# Patient Record
Sex: Female | Born: 1960 | Race: White | Hispanic: No | Marital: Married | Smoking: Never smoker
Health system: Southern US, Community
[De-identification: ages and names within clinical notes are randomized; demographics above are authoritative.]

## PROBLEM LIST (undated history)

## (undated) DIAGNOSIS — F419 Anxiety disorder, unspecified: Secondary | ICD-10-CM

## (undated) DIAGNOSIS — F32A Depression, unspecified: Secondary | ICD-10-CM

## (undated) DIAGNOSIS — F329 Major depressive disorder, single episode, unspecified: Secondary | ICD-10-CM

## (undated) DIAGNOSIS — K219 Gastro-esophageal reflux disease without esophagitis: Secondary | ICD-10-CM

## (undated) DIAGNOSIS — N6310 Unspecified lump in the right breast, unspecified quadrant: Secondary | ICD-10-CM

## (undated) HISTORY — PX: DIAGNOSTIC LAPAROSCOPY: SUR761

## (undated) HISTORY — PX: BREAST BIOPSY: SHX20

---

## 1997-05-26 ENCOUNTER — Other Ambulatory Visit: Admission: RE | Admit: 1997-05-26 | Discharge: 1997-05-26 | Payer: Self-pay | Admitting: Obstetrics and Gynecology

## 1998-06-01 ENCOUNTER — Other Ambulatory Visit: Admission: RE | Admit: 1998-06-01 | Discharge: 1998-06-01 | Payer: Self-pay | Admitting: Obstetrics and Gynecology

## 1998-06-16 ENCOUNTER — Encounter: Payer: Self-pay | Admitting: Obstetrics and Gynecology

## 1998-06-16 ENCOUNTER — Ambulatory Visit (HOSPITAL_COMMUNITY): Admission: RE | Admit: 1998-06-16 | Discharge: 1998-06-16 | Payer: Self-pay | Admitting: Obstetrics and Gynecology

## 1999-02-03 ENCOUNTER — Ambulatory Visit (HOSPITAL_COMMUNITY): Admission: RE | Admit: 1999-02-03 | Discharge: 1999-02-03 | Payer: Self-pay | Admitting: Neurosurgery

## 1999-02-03 ENCOUNTER — Encounter: Payer: Self-pay | Admitting: Neurosurgery

## 1999-06-05 ENCOUNTER — Other Ambulatory Visit: Admission: RE | Admit: 1999-06-05 | Discharge: 1999-06-05 | Payer: Self-pay | Admitting: Obstetrics and Gynecology

## 2000-06-26 ENCOUNTER — Other Ambulatory Visit: Admission: RE | Admit: 2000-06-26 | Discharge: 2000-06-26 | Payer: Self-pay | Admitting: *Deleted

## 2001-07-03 ENCOUNTER — Other Ambulatory Visit: Admission: RE | Admit: 2001-07-03 | Discharge: 2001-07-03 | Payer: Self-pay | Admitting: Obstetrics and Gynecology

## 2001-12-09 ENCOUNTER — Encounter (INDEPENDENT_AMBULATORY_CARE_PROVIDER_SITE_OTHER): Payer: Self-pay | Admitting: Specialist

## 2001-12-09 ENCOUNTER — Ambulatory Visit (HOSPITAL_COMMUNITY): Admission: RE | Admit: 2001-12-09 | Discharge: 2001-12-09 | Payer: Self-pay | Admitting: Gastroenterology

## 2002-04-28 DIAGNOSIS — C4491 Basal cell carcinoma of skin, unspecified: Secondary | ICD-10-CM

## 2002-04-28 HISTORY — DX: Basal cell carcinoma of skin, unspecified: C44.91

## 2002-07-15 ENCOUNTER — Other Ambulatory Visit: Admission: RE | Admit: 2002-07-15 | Discharge: 2002-07-15 | Payer: Self-pay | Admitting: Obstetrics and Gynecology

## 2002-08-26 ENCOUNTER — Ambulatory Visit (HOSPITAL_COMMUNITY): Admission: RE | Admit: 2002-08-26 | Discharge: 2002-08-26 | Payer: Self-pay | Admitting: Obstetrics and Gynecology

## 2002-08-26 ENCOUNTER — Encounter: Payer: Self-pay | Admitting: Obstetrics and Gynecology

## 2003-09-03 ENCOUNTER — Ambulatory Visit (HOSPITAL_COMMUNITY): Admission: RE | Admit: 2003-09-03 | Discharge: 2003-09-03 | Payer: Self-pay | Admitting: Obstetrics and Gynecology

## 2004-09-05 ENCOUNTER — Ambulatory Visit (HOSPITAL_COMMUNITY): Admission: RE | Admit: 2004-09-05 | Discharge: 2004-09-05 | Payer: Self-pay | Admitting: Obstetrics and Gynecology

## 2005-09-07 ENCOUNTER — Ambulatory Visit (HOSPITAL_COMMUNITY): Admission: RE | Admit: 2005-09-07 | Discharge: 2005-09-07 | Payer: Self-pay | Admitting: Obstetrics and Gynecology

## 2006-09-11 ENCOUNTER — Ambulatory Visit (HOSPITAL_COMMUNITY): Admission: RE | Admit: 2006-09-11 | Discharge: 2006-09-11 | Payer: Self-pay | Admitting: Obstetrics & Gynecology

## 2007-09-16 ENCOUNTER — Ambulatory Visit (HOSPITAL_COMMUNITY): Admission: RE | Admit: 2007-09-16 | Discharge: 2007-09-16 | Payer: Self-pay | Admitting: Obstetrics & Gynecology

## 2007-09-19 ENCOUNTER — Encounter: Admission: RE | Admit: 2007-09-19 | Discharge: 2007-09-19 | Payer: Self-pay | Admitting: Obstetrics & Gynecology

## 2007-10-08 ENCOUNTER — Encounter: Admission: RE | Admit: 2007-10-08 | Discharge: 2007-10-08 | Payer: Self-pay | Admitting: Neurosurgery

## 2007-10-23 ENCOUNTER — Encounter: Admission: RE | Admit: 2007-10-23 | Discharge: 2007-10-23 | Payer: Self-pay | Admitting: Neurosurgery

## 2008-10-01 ENCOUNTER — Encounter: Admission: RE | Admit: 2008-10-01 | Discharge: 2008-10-01 | Payer: Self-pay | Admitting: Obstetrics & Gynecology

## 2009-10-14 ENCOUNTER — Encounter: Admission: RE | Admit: 2009-10-14 | Discharge: 2009-10-14 | Payer: Self-pay | Admitting: Obstetrics & Gynecology

## 2010-06-09 NOTE — Op Note (Signed)
NAME:  Kelli Moore, Kelli Moore                            ACCOUNT NO.:  [id number]   MEDICAL RECORD NO.:  [id number]                   PATIENT TYPE:  AMB   LOCATION:  ENDO                                 FACILITY:  [facility]   PHYSICIAN:  Anselmo Rod, M.D.               DATE OF BIRTH:  [birth year]   DATE OF PROCEDURE:  12/09/2001  DATE OF DISCHARGE:                                 OPERATIVE REPORT   PROCEDURE PERFORMED:  Colonoscopy with biopsy.   ENDOSCOPIST:  Anselmo Rod, M.D.   INSTRUMENT USED:  Pediatric adjustable Olympus colonoscope.   INDICATIONS FOR PROCEDURE:  [age]-year-old white female with a family  history of rectal cancer in her mother and colon cancer in her maternal  uncle.  Rule out colonic polyps, masses, etc.   PREPROCEDURE PREPARATION:  Informed consent was procured from the patient.  The patient fasted for eight hours prior to the procedure and prepped with a  bottle of MiraLax and ________ the night prior to the procedure.   PREPROCEDURE PHYSICAL:  VITAL SIGNS:  The patient has stable vital signs.  NECK:  Supple.  CHEST:  Clear to auscultation.  HEART:  S1 and S2, regular.  ABDOMEN:  Soft with normal bowel sounds.   DESCRIPTION OF THE PROCEDURE:  The patient was placed in the left lateral  decubitus position and sedated with 70 mg of Demerol and 7 mg of Versed  intravenously.  Once the patient was adequately sedated and maintained on  low-flow oxygen, and continuous cardiac monitoring the Olympus video  colonoscope was advanced from the rectum to the cecum and terminal ileum  without difficulty.  There was patchy erythema noted in the rectum.  This  was biopsied to rule out proctitis.  The rest of the colonic mucosa up to  the terminal ileum appeared normal.  Small internal hemorrhoids were seen on  retroflexion.   IMPRESSION:  1. Normal colonoscopy up to the terminal ileum, except for small nonbleeding     internal hemorrhoids and patchy erythema in  the rectum, biopsies done;     results pending.  2. No large masses or polyps seen.   RECOMMENDATIONS:  1. Await pathology results.  2.     Outpatient follow up within the next two weeks for further recommendations.   3. Considering the patient's family history of rectal and colon cancer     repeat colorectal screening is recommended in next five years unless the     patient develops any abnormal symptoms in the interim.                                                 Anselmo Rod, M.D.    JNM/MEDQ  D:  12/09/2001  T:  12/10/2001  Job:  [id number]   cc:   Maxie Better, M.D.  [address]  [address]  [place]  [place] [address]  Fax: [phone]

## 2010-10-04 ENCOUNTER — Other Ambulatory Visit: Payer: Self-pay | Admitting: Obstetrics & Gynecology

## 2010-10-04 DIAGNOSIS — Z1231 Encounter for screening mammogram for malignant neoplasm of breast: Secondary | ICD-10-CM

## 2010-11-01 ENCOUNTER — Ambulatory Visit
Admission: RE | Admit: 2010-11-01 | Discharge: 2010-11-01 | Disposition: A | Discharge status: Home / Self Care | Payer: BC Managed Care – PPO | Source: Ambulatory Visit | Attending: Obstetrics & Gynecology | Admitting: Obstetrics & Gynecology

## 2010-11-01 DIAGNOSIS — Z1231 Encounter for screening mammogram for malignant neoplasm of breast: Secondary | ICD-10-CM

## 2011-10-02 ENCOUNTER — Other Ambulatory Visit: Payer: Self-pay | Admitting: Obstetrics & Gynecology

## 2011-10-02 DIAGNOSIS — Z1231 Encounter for screening mammogram for malignant neoplasm of breast: Secondary | ICD-10-CM

## 2011-10-19 ENCOUNTER — Other Ambulatory Visit: Payer: Self-pay | Admitting: Physician Assistant

## 2011-11-06 ENCOUNTER — Ambulatory Visit: Payer: BC Managed Care – PPO

## 2011-11-07 ENCOUNTER — Ambulatory Visit
Admission: RE | Admit: 2011-11-07 | Discharge: 2011-11-07 | Disposition: A | Discharge status: Home / Self Care | Payer: BC Managed Care – PPO | Source: Ambulatory Visit | Attending: Obstetrics & Gynecology | Admitting: Obstetrics & Gynecology

## 2011-11-07 DIAGNOSIS — Z1231 Encounter for screening mammogram for malignant neoplasm of breast: Secondary | ICD-10-CM

## 2012-01-08 ENCOUNTER — Emergency Department (HOSPITAL_COMMUNITY)
Admission: EM | Admit: 2012-01-08 | Discharge: 2012-01-09 | Disposition: A | Discharge status: Home / Self Care | Payer: Managed Care, Other (non HMO) | Attending: Emergency Medicine | Admitting: Emergency Medicine

## 2012-01-08 ENCOUNTER — Encounter (HOSPITAL_COMMUNITY): Payer: Self-pay | Admitting: *Deleted

## 2012-01-08 DIAGNOSIS — F411 Generalized anxiety disorder: Secondary | ICD-10-CM | POA: Insufficient documentation

## 2012-01-08 DIAGNOSIS — T4272XA Poisoning by unspecified antiepileptic and sedative-hypnotic drugs, intentional self-harm, initial encounter: Secondary | ICD-10-CM | POA: Insufficient documentation

## 2012-01-08 DIAGNOSIS — T50902A Poisoning by unspecified drugs, medicaments and biological substances, intentional self-harm, initial encounter: Secondary | ICD-10-CM

## 2012-01-08 DIAGNOSIS — F430 Acute stress reaction: Secondary | ICD-10-CM

## 2012-01-08 DIAGNOSIS — Z3202 Encounter for pregnancy test, result negative: Secondary | ICD-10-CM | POA: Insufficient documentation

## 2012-01-08 DIAGNOSIS — R000 Tachycardia, unspecified: Secondary | ICD-10-CM | POA: Insufficient documentation

## 2012-01-08 DIAGNOSIS — F3289 Other specified depressive episodes: Secondary | ICD-10-CM | POA: Insufficient documentation

## 2012-01-08 DIAGNOSIS — F329 Major depressive disorder, single episode, unspecified: Secondary | ICD-10-CM | POA: Insufficient documentation

## 2012-01-08 DIAGNOSIS — T4271XA Poisoning by unspecified antiepileptic and sedative-hypnotic drugs, accidental (unintentional), initial encounter: Secondary | ICD-10-CM | POA: Insufficient documentation

## 2012-01-08 DIAGNOSIS — T426X2A Poisoning by other antiepileptic and sedative-hypnotic drugs, intentional self-harm, initial encounter: Secondary | ICD-10-CM | POA: Insufficient documentation

## 2012-01-08 DIAGNOSIS — Z79899 Other long term (current) drug therapy: Secondary | ICD-10-CM | POA: Insufficient documentation

## 2012-01-08 HISTORY — DX: Depression, unspecified: F32.A

## 2012-01-08 HISTORY — DX: Major depressive disorder, single episode, unspecified: F32.9

## 2012-01-08 LAB — CBC
HCT: 39.6 % (ref 36.0–46.0)
Hemoglobin: 13.5 g/dL (ref 12.0–15.0)
MCH: 30.7 pg (ref 26.0–34.0)
MCHC: 34.1 g/dL (ref 30.0–36.0)
MCV: 90 fL (ref 78.0–100.0)
Platelets: 322 10*3/uL (ref 150–400)
RBC: 4.4 MIL/uL (ref 3.87–5.11)
RDW: 12.4 % (ref 11.5–15.5)
WBC: 8 10*3/uL (ref 4.0–10.5)

## 2012-01-08 LAB — COMPREHENSIVE METABOLIC PANEL
ALT: 14 U/L (ref 0–35)
AST: 17 U/L (ref 0–37)
Albumin: 3.7 g/dL (ref 3.5–5.2)
Alkaline Phosphatase: 50 U/L (ref 39–117)
BUN: 9 mg/dL (ref 6–23)
CO2: 25 mEq/L (ref 19–32)
Calcium: 9.5 mg/dL (ref 8.4–10.5)
Chloride: 101 mEq/L (ref 96–112)
Creatinine, Ser: 0.62 mg/dL (ref 0.50–1.10)
GFR calc Af Amer: >90 mL/min (ref >90)
GFR calc non Af Amer: >90 mL/min (ref >90)
Glucose, Bld: 96 mg/dL (ref 70–99)
Potassium: 3.7 mEq/L (ref 3.5–5.1)
Sodium: 135 mEq/L (ref 135–145)
Total Bilirubin: 0.1 mg/dL — ABNORMAL LOW (ref 0.3–1.2)
Total Protein: 7.2 g/dL (ref 6.0–8.3)

## 2012-01-08 LAB — URINALYSIS, ROUTINE W REFLEX MICROSCOPIC
Bilirubin Urine: NEGATIVE
Glucose, UA: NEGATIVE mg/dL
Ketones, ur: NEGATIVE mg/dL
Leukocytes, UA: NEGATIVE
Nitrite: NEGATIVE
Protein, ur: NEGATIVE mg/dL
Specific Gravity, Urine: 1.011 (ref 1.005–1.030)
Urobilinogen, UA: 0.2 mg/dL (ref 0.0–1.0)
pH: 8 (ref 5.0–8.0)

## 2012-01-08 LAB — RAPID URINE DRUG SCREEN, HOSP PERFORMED
Amphetamines: NOT DETECTED
Barbiturates: NOT DETECTED
Benzodiazepines: NOT DETECTED
Cocaine: NOT DETECTED
Opiates: NOT DETECTED
Tetrahydrocannabinol: NOT DETECTED

## 2012-01-08 LAB — SALICYLATE LEVEL: Salicylate Lvl: <2 mg/dL — ABNORMAL LOW (ref 2.8–20.0)

## 2012-01-08 LAB — ACETAMINOPHEN LEVEL: Acetaminophen (Tylenol), Serum: <15 ug/mL (ref 10–30)

## 2012-01-08 LAB — PREGNANCY, URINE: Preg Test, Ur: NEGATIVE

## 2012-01-08 LAB — URINE MICROSCOPIC-ADD ON

## 2012-01-08 MED ORDER — IBUPROFEN 600 MG PO TABS: 600.0000 mg | ORAL_TABLET | Freq: Three times a day (TID) | ORAL | Status: DC | PRN

## 2012-01-08 MED ORDER — ADULT MULTIVITAMIN W/MINERALS CH: 1.0000 | ORAL_TABLET | Freq: Every day | ORAL | Status: DC

## 2012-01-08 MED ORDER — ALUM & MAG HYDROXIDE-SIMETH 200-200-20 MG/5ML PO SUSP: 30.0000 mL | ORAL | Status: DC | PRN

## 2012-01-08 MED ORDER — ACETAMINOPHEN 325 MG PO TABS: 650.0000 mg | ORAL_TABLET | ORAL | Status: DC | PRN

## 2012-01-08 MED ORDER — ONDANSETRON HCL 4 MG PO TABS: 4.0000 mg | ORAL_TABLET | Freq: Three times a day (TID) | ORAL | Status: DC | PRN

## 2012-01-08 MED ORDER — LEVONORGESTREL-ETHINYL ESTRAD 0.1-20 MG-MCG PO TABS: 1.0000 | ORAL_TABLET | Freq: Every day | ORAL | Status: DC

## 2012-01-08 MED ORDER — SODIUM CHLORIDE 0.9 % IV BOLUS (SEPSIS)
1000.0000 mL | Freq: Once | INTRAVENOUS | Status: AC
  Administered 2012-01-08: 1000 mL via INTRAVENOUS

## 2012-01-08 NOTE — ED Notes (Signed)
Husband will be taking patient clothes home.

## 2012-01-08 NOTE — ED Notes (Signed)
Pt transferred from Main ED, presents with drug overdose of Prozac and Ambien tonight.  Pt brought in by husband.  Pt reports she had a severe anxiety attack. And took meds. Approximately 10Prozac and 4-5/10mg  Ambien.   Pt reports she is feeling hopeless at present.  Denies SI in past.  Husband reports pt having issues with boss at work, is really stressed out and missing her deceased mother.

## 2012-01-08 NOTE — ED Notes (Signed)
Family friend came to visit.

## 2012-01-08 NOTE — ED Notes (Signed)
Patient ambulated to bathroom with one assist because she was still feeling dizzy.

## 2012-01-08 NOTE — ED Notes (Signed)
Pt came home around lunch time from work, upset, told husband she just wanted to sleep, took unknown amount of prozac and ambein, Pt states " I kinda feel SI". Pt upset in triage, crying. Pt sleepy but can answer questions, confused on where she is.

## 2012-01-08 NOTE — ED Notes (Signed)
MD at bedside. 

## 2012-01-08 NOTE — ED Notes (Signed)
Patient has been having lots of stress at work and finally resigned today, came home and told her husband she just wanted to sleep.  Took unknown amount of Ambien and Prozac.  Patient tearful and drowsy.

## 2012-01-08 NOTE — ED Provider Notes (Signed)
History    51yf with drug overdose. Took 3-4 10mg  tabs of ambien and 4-5 20mg  fluoxetine. Took less than 1 hour prior to arrival. Pt upset over situation at work. Prior to this told husband that she felt like she was having a nervous breakdown. Then said she just wanted to go to sleep and took the pills. Husband found and very drowsy. Hx of depression. No previous suicide attempt. Pt not completely forthcoming in intent of taking the pills. Denies ETOH or drug use. Pt denies other ingestion and husband doesn't think she took anything else. Pt denies pain anywhere. No SOB. No n/v. No hallucinations.  CSN: [id number]  Arrival date & time 01/08/12  1455   First MD Initiated Contact with Patient 01/08/12 1508      Chief Complaint  Patient presents with  . Drug Overdose    (Consider location/radiation/quality/duration/timing/severity/associated sxs/prior treatment) HPI  Past Medical History  Diagnosis Date  . Depression     History reviewed. No pertinent past surgical history.  No family history on file.  History  Substance Use Topics  . Smoking status: Never Smoker   . Smokeless tobacco: Never Used  . Alcohol Use: No    OB History    Grav Para Term Preterm Abortions TAB SAB Ect Mult Living                  Review of Systems  All systems reviewed and negative, other than as noted in HPI.   Allergies  Review of patient's allergies indicates no known allergies.  Home Medications   Current Outpatient Rx  Name  Route  Sig  Dispense  Refill  . FLUOXETINE HCL 20 MG PO CAPS   Oral   Take 20 mg by mouth daily.          . ADULT MULTIVITAMIN W/MINERALS CH   Oral   Take 1 tablet by mouth daily.         . ORSYTHIA 0.1-20 MG-MCG PO TABS   Oral   Take 1 tablet by mouth daily.          Marland Kitchen ZOLPIDEM TARTRATE 10 MG PO TABS   Oral   Take 10 mg by mouth at bedtime as needed. For sleep.           BP 138/81  Pulse 123  Temp 98.3 F (36.8 C) (Oral)  Resp 18   SpO2 99%  Physical Exam  Nursing note and vitals reviewed. Constitutional: She appears well-developed and well-nourished. No distress.       No external signs of acute trauma  HENT:  Head: Normocephalic and atraumatic.  Eyes: Conjunctivae normal are normal. Pupils are equal, round, and reactive to light. Right eye exhibits no discharge. Left eye exhibits no discharge.  Neck: Neck supple.  Cardiovascular: Normal rate, regular rhythm and normal heart sounds.  Exam reveals no gallop and no friction rub.   No murmur heard. Pulmonary/Chest: Effort normal and breath sounds normal. No respiratory distress.  Abdominal: Soft. She exhibits no distension. There is no tenderness.  Musculoskeletal: She exhibits no edema and no tenderness.  Neurological: No cranial nerve deficit. She exhibits normal muscle tone. Coordination normal.       Drowsy. GCS 14. E3. Speech slow but clear. No focal deficits noted.  Skin: Skin is warm and dry.  Psychiatric: Her behavior is normal. Thought content normal.       Crying at times. Speech slow but clear. Content appropriate. Does not appear to be responding  to internal stimuli.     ED Course  Procedures (including critical care time)  Labs Reviewed  COMPREHENSIVE METABOLIC PANEL - Abnormal; Notable for the following:    Total Bilirubin 0.1 (*)     All other components within normal limits  SALICYLATE LEVEL - Abnormal; Notable for the following:    Salicylate Lvl <2.0 (*)     All other components within normal limits  URINALYSIS, ROUTINE W REFLEX MICROSCOPIC - Abnormal; Notable for the following:    Hgb urine dipstick SMALL (*)     All other components within normal limits  CBC  ACETAMINOPHEN LEVEL  PREGNANCY, URINE  URINE RAPID DRUG SCREEN (HOSP PERFORMED)  URINE MICROSCOPIC-ADD ON   No results found.  EKG:  Rhythm: sinus tachycardia Rate: 104 Axis: normal Intervals/Conduction: LAE.  ST segments: normal   1. Intentional drug overdose   2.  Stress reaction       MDM  51yF with intentional drug overdose of unclear intent. Tachycardic but otherwise HD stable. Drowsy but awakens to voice. Protecting airway. Will medically screen/clear and then obtain psychiatry consultation.  Patient has been medically cleared at this time. Tachycardia resolved. Patient still protecting her airway. HD stable. Workup fairly unremarkable        Raeford Razor, MD 01/08/12 2219

## 2012-01-08 NOTE — ED Notes (Signed)
Report called to psyche ED. 

## 2012-01-09 MED ORDER — CLONAZEPAM 0.5 MG PO TABS: 0.5000 mg | ORAL_TABLET | Freq: Every day | ORAL | Status: DC

## 2012-01-09 NOTE — Progress Notes (Signed)
CSW met with pt at nurses station. Pt stated she was ready to leave and that we could not keep her here against her will. CSW discussed with pt the process of having the psychiatrist consult. Pt stated that she did not attempt suicide. CSW explained to patient that due to the amount of medication that she took, it can be interpreted to be an attempt to commit suicide and until the psychiatrist evaluates patient the MD will not allow patient to leave. CSW explained to patient that if she did not want to wait for the psychiatrist evaluation then patient would be involuntarily commited. Patient agreed to wait for psychiatrist.   Pt stated she is tired of waiting and ready to be home. Pt asked if her husband was aware. CSW informed patient that pt husband was informed of visiting hours and telephone hours here and then pending an evaluation patient would be in the psychiatric ed. CSW explained to patient that after patient has been seen by psychiatrist and patient would like information to be shared with pt husband, pt can sign a consent to release information.   Catha Gosselin, LCSWA  [phone] .01/09/2012 1200pm

## 2012-01-09 NOTE — ED Provider Notes (Signed)
  Physical Exam  BP 142/88  Pulse 88  Temp 98.8 F (37.1 C) (Oral)  Resp 20  SpO2 98%  Physical Exam  ED Course  Procedures  MDM I have discussed the psychiatrist recommendation with the patient, she states that she feels comfortable with recommendations, she has a bright affect and denies any suicidal thoughts at this time. She will be stable for discharge. Recommendations for starting clonazepam nightly have been started, continue Prozac, Ambien when necessary      Vida Roller, MD 01/09/12 [phone]

## 2012-08-25 ENCOUNTER — Other Ambulatory Visit: Payer: Self-pay

## 2012-08-25 DIAGNOSIS — Z1231 Encounter for screening mammogram for malignant neoplasm of breast: Secondary | ICD-10-CM

## 2012-11-03 ENCOUNTER — Ambulatory Visit: Payer: BC Managed Care – PPO | Admitting: Cardiovascular Disease

## 2012-11-07 ENCOUNTER — Ambulatory Visit
Admission: RE | Admit: 2012-11-07 | Discharge: 2012-11-07 | Disposition: A | Discharge status: Home / Self Care | Payer: Managed Care, Other (non HMO) | Source: Ambulatory Visit

## 2012-11-07 DIAGNOSIS — Z1231 Encounter for screening mammogram for malignant neoplasm of breast: Secondary | ICD-10-CM

## 2013-10-13 ENCOUNTER — Other Ambulatory Visit: Payer: Self-pay

## 2013-10-14 ENCOUNTER — Other Ambulatory Visit: Payer: Self-pay

## 2013-10-14 DIAGNOSIS — Z1231 Encounter for screening mammogram for malignant neoplasm of breast: Secondary | ICD-10-CM

## 2013-11-09 ENCOUNTER — Ambulatory Visit
Admission: RE | Admit: 2013-11-09 | Discharge: 2013-11-09 | Disposition: A | Discharge status: Home / Self Care | Payer: Managed Care, Other (non HMO) | Source: Ambulatory Visit

## 2013-11-09 DIAGNOSIS — Z1231 Encounter for screening mammogram for malignant neoplasm of breast: Secondary | ICD-10-CM

## 2014-01-22 HISTORY — PX: BUNIONECTOMY: SHX129

## 2014-04-12 ENCOUNTER — Telehealth: Payer: Self-pay | Admitting: Cardiovascular Disease

## 2014-04-12 NOTE — Telephone Encounter (Signed)
Have her see PA this week I texted husband Needs ECG and lab work

## 2014-04-12 NOTE — Telephone Encounter (Signed)
New Message  Pt husband Ronalee Belts [place]) and father are pts of Dr. Johnsie Cancel, and insisted on letting Dr. Johnsie Cancel know of pt's recent heart 'fluttering'. Pt was given next available NP appt, but requested to speak w/ Rn. Please call back and discuss.

## 2014-04-12 NOTE — Telephone Encounter (Signed)
SPOKE WITH PTS  HUSBAND  PT  HAS  HAD   SEVERAL  LONG LASTING EPISODES OF  PALPITATIONS   OVER  THE  LAST  COUPLE OF DAYS  IS  FEELING  LIGHTHEADED AND   EXHAUSTED  IS  REQUESTING EARLIER APPT  NOTHING  AVAILABLE  WILL DISCUSS WITH DR  Johnsie Cancel .Adonis Housekeeper

## 2014-04-12 NOTE — Telephone Encounter (Signed)
PT  TO BE   PUT  ON FLEX  PA  SCHEDULE FOR  WED  AS  DR Johnsie Cancel  IS  DOD THAT DAY  PT'S  HUSBAND  AWARE  TO EXPECT  CALL WITH TIME  FOR  WED .Adonis Housekeeper

## 2014-04-13 ENCOUNTER — Telehealth: Payer: Self-pay | Admitting: Cardiovascular Disease

## 2014-04-13 NOTE — Telephone Encounter (Signed)
Walk in pt form " pt dropped off papers for Nurse" gave to [facility]

## 2014-04-14 ENCOUNTER — Encounter: Payer: Self-pay | Admitting: Cardiology

## 2014-04-14 ENCOUNTER — Ambulatory Visit (INDEPENDENT_AMBULATORY_CARE_PROVIDER_SITE_OTHER): Payer: Managed Care, Other (non HMO) | Admitting: Cardiology

## 2014-04-14 VITALS — BP 114/74 | HR 84 | Ht >= 80 in | Wt 147.4 lb

## 2014-04-14 DIAGNOSIS — R002 Palpitations: Secondary | ICD-10-CM | POA: Insufficient documentation

## 2014-04-14 MED ORDER — PROPRANOLOL HCL 10 MG PO TABS: 10.0000 mg | ORAL_TABLET | Freq: Two times a day (BID) | ORAL | Status: DC

## 2014-04-14 NOTE — Patient Instructions (Signed)
START TAKING INDERAL 10 MG TWICE AS NEEDED FOR PALPITATIONS     CONTACT THE OFFICE IF YOU HAVE ANY RETURNING SYMPTOMS

## 2014-04-14 NOTE — Progress Notes (Signed)
    04/14/2014 Kelli Moore   22-Jun-1960  [id number]  Primary Physician Limmie Patricia, MD Primary Cardiologist: Dr Johnsie Cancel  HPI:  54 y/o female, her father is a pt of Dr Johnsie Cancel and her husband is followed by Dr Johnsie Cancel. She has been seen in the past for palpations, she saw Dr Wynonia Lawman in 2014. She had a monitor and an echo then. It was determined she had PVCs. After that her symptoms went away till this past weekend. This past weekend she had several episodes of brief tachycardia- "7-10 beats". She told she had taken pseudoephedrine for sinus headache this past week. She has not had recurrent palpitations the last 24-48 hrs.    Current Outpatient Prescriptions  Medication Sig Dispense Refill  . Cholecalciferol (VITAMIN D3) 5000 UNITS CAPS Take 1 capsule by mouth daily.    . Cyanocobalamin (VITAMIN B 12 PO) Take 2,500 mg by mouth 2 (two) times daily.    . norethindrone (MICRONOR,CAMILA,ERRIN) 0.35 MG tablet Take 1 tablet by mouth daily.     . vitamin C (ASCORBIC ACID) 500 MG tablet Take 500 mg by mouth daily.    Marland Kitchen zolpidem (AMBIEN) 10 MG tablet Take 5 mg by mouth at bedtime. Patient taking one half tablet (5 mg) by mouth nightly for sleep     No current facility-administered medications for this visit.    No Known Allergies  History   Social History  . Marital Status: Married    Spouse Name: N/A  . Number of Children: N/A  . Years of Education: N/A   Occupational History  . Not on file.   Social History Main Topics  . Smoking status: Never Smoker   . Smokeless tobacco: Never Used  . Alcohol Use: No  . Drug Use: No  . Sexual Activity: Not on file   Other Topics Concern  . Not on file   Social History Narrative     Review of Systems: General: negative for chills, fever, night sweats or weight changes.  Cardiovascular: negative for chest pain, dyspnea on exertion, edema, orthopnea, palpitations, paroxysmal nocturnal dyspnea or shortness of breath Dermatological:  negative for rash Respiratory: negative for cough or wheezing Urologic: negative for hematuria Abdominal: negative for nausea, vomiting, diarrhea, bright red blood per rectum, melena, or hematemesis Neurologic: negative for visual changes, syncope, or dizziness All other systems reviewed and are otherwise negative except as noted above.    Blood pressure 114/74, pulse 84, height 7' (2.134 m), weight 147 lb 6.4 oz (66.86 kg).  General appearance: alert, cooperative and no distress Neck: no carotid bruit and no JVD Lungs: clear to auscultation bilaterally Heart: regular rate and rhythm Abdomen: soft, non-tender; bowel sounds normal; no masses,  no organomegaly Extremities: extremities normal, atraumatic, no cyanosis or edema Pulses: 2+ and symmetric Skin: Skin color, texture, turgor normal. No rashes or lesions Neurologic: Grossly normal  EKG NSR  ASSESSMENT AND PLAN:   Palpitations Seen as an add on for palpitations     PLAN  Pt was seen by Dr Johnsie Cancel and myself. We instructed her to avoid any stimulants. She was given an RX for Inderal 10 mg BID prn palpitations. We'll see her as needed.   Cedrick Partain KPA-C 04/14/2014 11:46 AM

## 2014-04-14 NOTE — Assessment & Plan Note (Signed)
Seen as an add on for palpitations 

## 2014-05-04 ENCOUNTER — Ambulatory Visit: Payer: Managed Care, Other (non HMO) | Admitting: Cardiovascular Disease

## 2014-09-22 ENCOUNTER — Ambulatory Visit (INDEPENDENT_AMBULATORY_CARE_PROVIDER_SITE_OTHER): Payer: Managed Care, Other (non HMO) | Admitting: Podiatry

## 2014-09-22 ENCOUNTER — Encounter: Payer: Self-pay | Admitting: Podiatry

## 2014-09-22 ENCOUNTER — Ambulatory Visit (INDEPENDENT_AMBULATORY_CARE_PROVIDER_SITE_OTHER): Payer: Managed Care, Other (non HMO)

## 2014-09-22 VITALS — BP 119/71 | HR 84 | Resp 16 | Ht 64.5 in | Wt 142.0 lb

## 2014-09-22 DIAGNOSIS — M779 Enthesopathy, unspecified: Secondary | ICD-10-CM

## 2014-09-22 DIAGNOSIS — M2010 Hallux valgus (acquired), unspecified foot: Secondary | ICD-10-CM | POA: Diagnosis not present

## 2014-09-22 MED ORDER — DICLOFENAC SODIUM 75 MG PO TBEC: 75.0000 mg | DELAYED_RELEASE_TABLET | Freq: Two times a day (BID) | ORAL | Status: DC

## 2014-09-22 NOTE — Patient Instructions (Signed)
Pre-Operative Instructions  Congratulations, you have decided to take an important step to improving your quality of life.  You can be assured that the doctors of [facility] will be with you every step of the way.  1. Plan to be at the surgery center/hospital at least 1 (one) hour prior to your scheduled time unless otherwise directed by the surgical center/hospital staff.  You must have a responsible adult accompany you, remain during the surgery and drive you home.  Make sure you have directions to the surgical center/hospital and know how to get there on time. 2. For hospital based surgery you will need to obtain a history and physical form from your family physician within 1 month prior to the date of surgery- we will give you a form for you primary physician.  3. We make every effort to accommodate the date you request for surgery.  There are however, times where surgery dates or times have to be moved.  We will contact you as soon as possible if a change in schedule is required.   4. No Aspirin/Ibuprofen for one week before surgery.  If you are on aspirin, any non-steroidal anti-inflammatory medications (Mobic, Aleve, Ibuprofen) you should stop taking it 7 days prior to your surgery.  You make take Tylenol  For pain prior to surgery.  5. Medications- If you are taking daily heart and blood pressure medications, seizure, reflux, allergy, asthma, anxiety, pain or diabetes medications, make sure the surgery center/hospital is aware before the day of surgery so they may notify you which medications to take or avoid the day of surgery. 6. No food or drink after midnight the night before surgery unless directed otherwise by surgical center/hospital staff. 7. No alcoholic beverages 24 hours prior to surgery.  No smoking 24 hours prior to or 24 hours after surgery. 8. Wear loose pants or shorts- loose enough to fit over bandages, boots, and casts. 9. No slip on shoes, sneakers are best. 10. Bring  your boot with you to the surgery center/hospital.  Also bring crutches or a walker if your physician has prescribed it for you.  If you do not have this equipment, it will be provided for you after surgery. 11. If you have not been contracted by the surgery center/hospital by the day before your surgery, call to confirm the date and time of your surgery. 12. Leave-time from work may vary depending on the type of surgery you have.  Appropriate arrangements should be made prior to surgery with your employer. 13. Prescriptions will be provided immediately following surgery by your doctor.  Have these filled as soon as possible after surgery and take the medication as directed. 14. Remove nail polish on the operative foot. 15. Wash the night before surgery.  The night before surgery wash the foot and leg well with the antibacterial soap provided and water paying special attention to beneath the toenails and in between the toes.  Rinse thoroughly with water and dry well with a towel.  Perform this wash unless told not to do so by your physician.  Enclosed: 1 Ice pack (please put in freezer the night before surgery)   1 Hibiclens skin cleaner   Pre-op Instructions  If you have any questions regarding the instructions, do not hesitate to call our office.  [place]: [address] [place], [place] [address] [phone]  [place]: [address] [place], [place] [address] [phone]  [place]: [address]  [place],  [address] [phone]  Dr. Richard   Tuchman DPM, Dr. Norman Regal DPM Dr. Richard Sikora DPM, Dr. M. Todd Hyatt DPM, Dr. Kathryn Egerton DPM 

## 2014-09-22 NOTE — Progress Notes (Signed)
   Subjective:    Patient ID: Kelli Moore, female    DOB: Jun 21, 1960, 54 y.o.   MRN: [id number]  HPI Patient presents with bilateral bunions. Pt stated, "right foot bothers more; painful when wearing certain types of shoes". This has been going on for the past 15 years.   Review of Systems  All other systems reviewed and are negative.      Objective:   Physical Exam        Assessment & Plan:

## 2014-09-22 NOTE — Progress Notes (Signed)
Subjective:     Patient ID: Kelli Moore, female   DOB: 1960-05-11, 54 y.o.   MRN: [id number]  HPI patient states I have painful bunion deformity right over left and it's been going on for a number of years. I've tried wider shoes I've tried padding oral anti-inflammatory cyst and soaks without relief and I know I need to get it corrected and I have a significant family history with both my mother and grandmother having this problem   Review of Systems  All other systems reviewed and are negative.      Objective:   Physical Exam  Constitutional: She is oriented to person, place, and time.  Cardiovascular: Intact distal pulses.   Musculoskeletal: Normal range of motion.  Neurological: She is oriented to person, place, and time.  Skin: Skin is warm.  Nursing note and vitals reviewed.  neurovascular status intact muscle strength adequate range of motion within normal limits. Patient's noted to have good digital perfusion is well oriented 3 and has large hyperostosis medial aspect first metatarsal head right over left with redness around the first metatarsal head and pain in the area is palpated. Patient has deviation of the hallux against the second toe with no interspace lesion noted     Assessment:     Significant structural bunion deformity right over left that's symptomatic and painful right over left foot    Plan:     H&P and x-rays reviewed with patient. Discussed deformity and consideration conservative and surgical and patient has tried numerous conservative treatments I would like to have it fixed. I recommended Austin-type osteotomy right explaining procedures and reviewing complications with this procedure. She'll reappoint 2 weeks for final discussion and his tenably scheduled for surgery in approximately 3 weeks. X-ray report indicates elevation of the 1 to intermetatarsal angle right foot of approximate 16 and 14 on the left foot

## 2014-10-04 ENCOUNTER — Telehealth: Payer: Self-pay | Admitting: *Deleted

## 2014-10-04 NOTE — Telephone Encounter (Signed)
I called [employer] to see if pre-authorization is needed for surgery scheduled for 10/12/2014.  Authorization is not needed for [id number].  Patient has a co-pay of 20%.  Insurance is active, been active since 01/22/2013.  Confirmation number is [id number].

## 2014-10-06 ENCOUNTER — Ambulatory Visit (INDEPENDENT_AMBULATORY_CARE_PROVIDER_SITE_OTHER): Payer: Managed Care, Other (non HMO) | Admitting: Podiatry

## 2014-10-06 ENCOUNTER — Encounter: Payer: Self-pay | Admitting: Podiatry

## 2014-10-06 VITALS — BP 147/84 | HR 83 | Resp 16

## 2014-10-06 DIAGNOSIS — M779 Enthesopathy, unspecified: Secondary | ICD-10-CM

## 2014-10-06 DIAGNOSIS — M2010 Hallux valgus (acquired), unspecified foot: Secondary | ICD-10-CM

## 2014-10-06 NOTE — Progress Notes (Signed)
Subjective:     Patient ID: Kelli Moore, female   DOB: 03/06/1960, 54 y.o.   MRN: [id number]  HPI patient states I need to get my bunion fixed on my right foot and I am here for consult and I'm presenting with husband today   Review of Systems     Objective:   Physical Exam Neurovascular status intact muscle strength adequate with large hyperostosis medial aspect first metatarsal head right that she has tried wider shoes for she's tried soaks and padding modifications without relief.    Assessment:     Structural HAV deformity right over left    Plan:     H&P and x-rays reviewed with patient. Correction is been recommended due to long-standing nature and patient wants surgery and at this time I reviewed consent form for Austin-type osteotomy and reviewed alternative treatments and complications associated with surgery. Patient scheduled for Austin-type surgery understanding complications and risk and she understands recovery can be 6 months to one year. I dispensed air fracture walker with instructions on usage

## 2014-10-06 NOTE — Patient Instructions (Signed)
Pre-Operative Instructions  Congratulations, you have decided to take an important step to improving your quality of life.  You can be assured that the doctors of [facility] will be with you every step of the way.  1. Plan to be at the surgery center/hospital at least 1 (one) hour prior to your scheduled time unless otherwise directed by the surgical center/hospital staff.  You must have a responsible adult accompany you, remain during the surgery and drive you home.  Make sure you have directions to the surgical center/hospital and know how to get there on time. 2. For hospital based surgery you will need to obtain a history and physical form from your family physician within 1 month prior to the date of surgery- we will give you a form for you primary physician.  3. We make every effort to accommodate the date you request for surgery.  There are however, times where surgery dates or times have to be moved.  We will contact you as soon as possible if a change in schedule is required.   4. No Aspirin/Ibuprofen for one week before surgery.  If you are on aspirin, any non-steroidal anti-inflammatory medications (Mobic, Aleve, Ibuprofen) you should stop taking it 7 days prior to your surgery.  You make take Tylenol  For pain prior to surgery.  5. Medications- If you are taking daily heart and blood pressure medications, seizure, reflux, allergy, asthma, anxiety, pain or diabetes medications, make sure the surgery center/hospital is aware before the day of surgery so they may notify you which medications to take or avoid the day of surgery. 6. No food or drink after midnight the night before surgery unless directed otherwise by surgical center/hospital staff. 7. No alcoholic beverages 24 hours prior to surgery.  No smoking 24 hours prior to or 24 hours after surgery. 8. Wear loose pants or shorts- loose enough to fit over bandages, boots, and casts. 9. No slip on shoes, sneakers are best. 10. Bring  your boot with you to the surgery center/hospital.  Also bring crutches or a walker if your physician has prescribed it for you.  If you do not have this equipment, it will be provided for you after surgery. 11. If you have not been contracted by the surgery center/hospital by the day before your surgery, call to confirm the date and time of your surgery. 12. Leave-time from work may vary depending on the type of surgery you have.  Appropriate arrangements should be made prior to surgery with your employer. 13. Prescriptions will be provided immediately following surgery by your doctor.  Have these filled as soon as possible after surgery and take the medication as directed. 14. Remove nail polish on the operative foot. 15. Wash the night before surgery.  The night before surgery wash the foot and leg well with the antibacterial soap provided and water paying special attention to beneath the toenails and in between the toes.  Rinse thoroughly with water and dry well with a towel.  Perform this wash unless told not to do so by your physician.  Enclosed: 1 Ice pack (please put in freezer the night before surgery)   1 Hibiclens skin cleaner   Pre-op Instructions  If you have any questions regarding the instructions, do not hesitate to call our office.  [place]: [address] [place], [place] [address] [phone]  [place]: [address] , [place] [address] [phone]  [place]: [address]  [place], [place] [address] [phone]  Dr. Richard   Tuchman DPM, Dr. Norman Regal DPM Dr. Richard Sikora DPM, Dr. M. Todd Hyatt DPM, Dr. Kathryn Egerton DPM 

## 2014-10-11 ENCOUNTER — Other Ambulatory Visit: Payer: Self-pay

## 2014-10-11 DIAGNOSIS — Z1231 Encounter for screening mammogram for malignant neoplasm of breast: Secondary | ICD-10-CM

## 2014-10-12 ENCOUNTER — Encounter: Payer: Self-pay | Admitting: *Deleted

## 2014-10-12 DIAGNOSIS — M2011 Hallux valgus (acquired), right foot: Secondary | ICD-10-CM | POA: Diagnosis not present

## 2014-10-14 ENCOUNTER — Telehealth: Payer: Self-pay | Admitting: *Deleted

## 2014-10-14 NOTE — Telephone Encounter (Signed)
I attempted to call patient to see how she's doing from surgery.  I left a message to call if she has any questions or concerns.  Remember to keep foot elevated as much as possible.  Take medication as prescribed.

## 2014-10-14 NOTE — Progress Notes (Signed)
Surgery performed at [facility] for an [facility] right foot by Dr. Paulla Dolly.  Prescription was written for Percocet 10/325, quantity 35.

## 2014-10-22 ENCOUNTER — Other Ambulatory Visit: Payer: Self-pay

## 2014-10-22 ENCOUNTER — Ambulatory Visit (INDEPENDENT_AMBULATORY_CARE_PROVIDER_SITE_OTHER): Payer: Managed Care, Other (non HMO) | Admitting: Podiatry

## 2014-10-22 ENCOUNTER — Ambulatory Visit (INDEPENDENT_AMBULATORY_CARE_PROVIDER_SITE_OTHER): Payer: Managed Care, Other (non HMO)

## 2014-10-22 VITALS — BP 112/88 | HR 74 | Temp 99.5°F | Resp 16

## 2014-10-22 DIAGNOSIS — M2010 Hallux valgus (acquired), unspecified foot: Secondary | ICD-10-CM

## 2014-10-22 DIAGNOSIS — Z9889 Other specified postprocedural states: Secondary | ICD-10-CM

## 2014-10-22 DIAGNOSIS — M2011 Hallux valgus (acquired), right foot: Secondary | ICD-10-CM | POA: Diagnosis not present

## 2014-10-22 NOTE — Progress Notes (Signed)
Subjective:     Patient ID: Kelli Moore, female   DOB: 1960-06-02, 54 y.o.   MRN: [id number]  HPI patient states I'm doing real well with my right foot and able to walk distances without pain and swelling   Review of Systems     Objective:   Physical Exam Neurovascular status intact muscle strength adequate with patient noted to have well-healed coapted site first MPJ right with good alignment of the toe noted and good range of motion. Negative Homans sign noted    Assessment:     Doing well from structural bunion correction right    Plan:     Reviewed condition and x-rays and at this time reapplied sterile dressing dispensed surgical shoe and advised on continued compression elevation. Reappoint in approximately 3 weeks or earlier if any issues should occur

## 2014-11-02 ENCOUNTER — Other Ambulatory Visit: Payer: Self-pay | Admitting: Orthopedic Surgery

## 2014-11-11 ENCOUNTER — Ambulatory Visit
Admission: RE | Admit: 2014-11-11 | Discharge: 2014-11-11 | Disposition: A | Discharge status: Home / Self Care | Payer: Managed Care, Other (non HMO) | Source: Ambulatory Visit

## 2014-11-11 DIAGNOSIS — Z1231 Encounter for screening mammogram for malignant neoplasm of breast: Secondary | ICD-10-CM

## 2014-11-15 ENCOUNTER — Encounter (HOSPITAL_BASED_OUTPATIENT_CLINIC_OR_DEPARTMENT_OTHER): Payer: Self-pay | Admitting: *Deleted

## 2014-11-15 ENCOUNTER — Other Ambulatory Visit: Payer: Self-pay | Admitting: Obstetrics & Gynecology

## 2014-11-15 DIAGNOSIS — R928 Other abnormal and inconclusive findings on diagnostic imaging of breast: Secondary | ICD-10-CM

## 2014-11-16 ENCOUNTER — Encounter (HOSPITAL_BASED_OUTPATIENT_CLINIC_OR_DEPARTMENT_OTHER): Payer: Self-pay | Admitting: Orthopedic Surgery

## 2014-11-16 ENCOUNTER — Ambulatory Visit (HOSPITAL_BASED_OUTPATIENT_CLINIC_OR_DEPARTMENT_OTHER)
Admission: RE | Admit: 2014-11-16 | Discharge: 2014-11-16 | Disposition: A | Discharge status: Home / Self Care | Payer: Managed Care, Other (non HMO) | Source: Ambulatory Visit | Attending: Orthopedic Surgery | Admitting: Orthopedic Surgery

## 2014-11-16 ENCOUNTER — Ambulatory Visit (HOSPITAL_BASED_OUTPATIENT_CLINIC_OR_DEPARTMENT_OTHER): Payer: Managed Care, Other (non HMO) | Admitting: Certified Registered"

## 2014-11-16 ENCOUNTER — Encounter (HOSPITAL_BASED_OUTPATIENT_CLINIC_OR_DEPARTMENT_OTHER)
Admission: RE | Disposition: A | Discharge status: Home / Self Care | Payer: Self-pay | Source: Ambulatory Visit | Attending: Orthopedic Surgery

## 2014-11-16 DIAGNOSIS — G5601 Carpal tunnel syndrome, right upper limb: Secondary | ICD-10-CM | POA: Insufficient documentation

## 2014-11-16 DIAGNOSIS — M65841 Other synovitis and tenosynovitis, right hand: Secondary | ICD-10-CM | POA: Diagnosis not present

## 2014-11-16 HISTORY — PX: TRIGGER FINGER RELEASE: SHX641

## 2014-11-16 HISTORY — DX: Anxiety disorder, unspecified: F41.9

## 2014-11-16 HISTORY — PX: CARPAL TUNNEL RELEASE: SHX101

## 2014-11-16 SURGERY — RELEASE, A1 PULLEY, FOR TRIGGER FINGER
Anesthesia: Regional | Site: Wrist | Laterality: Right

## 2014-11-16 MED ORDER — MIDAZOLAM HCL 2 MG/2ML IJ SOLN
1.0000 mg | INTRAMUSCULAR | Status: DC | PRN
  Administered 2014-11-16: 2 mg via INTRAVENOUS

## 2014-11-16 MED ORDER — ACETAMINOPHEN 325 MG PO TABS
ORAL_TABLET | ORAL | Status: AC
  Filled 2014-11-16: qty 2

## 2014-11-16 MED ORDER — HYDROMORPHONE HCL 1 MG/ML IJ SOLN: 0.2500 mg | INTRAMUSCULAR | Status: DC | PRN

## 2014-11-16 MED ORDER — ONDANSETRON HCL 4 MG/2ML IJ SOLN
INTRAMUSCULAR | Status: DC | PRN
  Administered 2014-11-16: 4 mg via INTRAVENOUS

## 2014-11-16 MED ORDER — FENTANYL CITRATE (PF) 100 MCG/2ML IJ SOLN
50.0000 ug | INTRAMUSCULAR | Status: DC | PRN
  Administered 2014-11-16: 50 ug via INTRAVENOUS

## 2014-11-16 MED ORDER — FENTANYL CITRATE (PF) 100 MCG/2ML IJ SOLN
INTRAMUSCULAR | Status: AC
  Filled 2014-11-16: qty 4

## 2014-11-16 MED ORDER — GLYCOPYRROLATE 0.2 MG/ML IJ SOLN: 0.2000 mg | Freq: Once | INTRAMUSCULAR | Status: DC | PRN

## 2014-11-16 MED ORDER — LACTATED RINGERS IV SOLN
INTRAVENOUS | Status: DC
  Administered 2014-11-16: 11:00:00 via INTRAVENOUS

## 2014-11-16 MED ORDER — CEFAZOLIN SODIUM-DEXTROSE 2-3 GM-% IV SOLR: 2.0000 g | INTRAVENOUS | Status: DC

## 2014-11-16 MED ORDER — PROPOFOL 10 MG/ML IV BOLUS
INTRAVENOUS | Status: DC | PRN
  Administered 2014-11-16 (×2): 10 mg via INTRAVENOUS
  Administered 2014-11-16: 20 mg via INTRAVENOUS

## 2014-11-16 MED ORDER — CEFAZOLIN SODIUM-DEXTROSE 2-3 GM-% IV SOLR
INTRAVENOUS | Status: AC
  Filled 2014-11-16: qty 50

## 2014-11-16 MED ORDER — LIDOCAINE HCL (PF) 0.5 % IJ SOLN
INTRAMUSCULAR | Status: DC | PRN
  Administered 2014-11-16: 30 mL via INTRAVENOUS

## 2014-11-16 MED ORDER — HYDROCODONE-ACETAMINOPHEN 5-325 MG PO TABS
1.0000 | ORAL_TABLET | Freq: Four times a day (QID) | ORAL | Status: DC | PRN
Start: 2014-11-16 — End: 2016-11-19

## 2014-11-16 MED ORDER — MIDAZOLAM HCL 2 MG/2ML IJ SOLN
INTRAMUSCULAR | Status: AC
  Filled 2014-11-16: qty 4

## 2014-11-16 MED ORDER — LIDOCAINE HCL (CARDIAC) 20 MG/ML IV SOLN
INTRAVENOUS | Status: DC | PRN
  Administered 2014-11-16: 20 mg via INTRAVENOUS

## 2014-11-16 MED ORDER — BUPIVACAINE HCL (PF) 0.25 % IJ SOLN
INTRAMUSCULAR | Status: DC | PRN
  Administered 2014-11-16: 6 mL

## 2014-11-16 MED ORDER — CHLORHEXIDINE GLUCONATE 4 % EX LIQD: 60.0000 mL | Freq: Once | CUTANEOUS | Status: DC

## 2014-11-16 MED ORDER — MEPERIDINE HCL 25 MG/ML IJ SOLN: 6.2500 mg | INTRAMUSCULAR | Status: DC | PRN

## 2014-11-16 MED ORDER — ACETAMINOPHEN 325 MG PO TABS
650.0000 mg | ORAL_TABLET | Freq: Once | ORAL | Status: DC | PRN
Start: 2014-11-16 — End: 2014-11-16
  Administered 2014-11-16: 650 mg via ORAL

## 2014-11-16 MED ORDER — SCOPOLAMINE 1 MG/3DAYS TD PT72: 1.0000 | MEDICATED_PATCH | Freq: Once | TRANSDERMAL | Status: DC | PRN

## 2014-11-16 MED ORDER — ONDANSETRON HCL 4 MG/2ML IJ SOLN: 4.0000 mg | Freq: Once | INTRAMUSCULAR | Status: DC | PRN

## 2014-11-16 MED ORDER — CEFAZOLIN SODIUM-DEXTROSE 2-3 GM-% IV SOLR
2.0000 g | INTRAVENOUS | Status: AC
  Administered 2014-11-16: 2 g via INTRAVENOUS

## 2014-11-16 SURGICAL SUPPLY — 36 items
BANDAGE COBAN STERILE 2 (GAUZE/BANDAGES/DRESSINGS) ×3 IMPLANT
BLADE SURG 15 STRL LF DISP TIS (BLADE) ×2 IMPLANT
BLADE SURG 15 STRL SS (BLADE) ×1
BNDG COHESIVE 3X5 TAN STRL LF (GAUZE/BANDAGES/DRESSINGS) ×3 IMPLANT
BNDG ESMARK 4X9 LF (GAUZE/BANDAGES/DRESSINGS) IMPLANT
BNDG GAUZE ELAST 4 BULKY (GAUZE/BANDAGES/DRESSINGS) ×3 IMPLANT
CHLORAPREP W/TINT 26ML (MISCELLANEOUS) ×3 IMPLANT
CORDS BIPOLAR (ELECTRODE) ×3 IMPLANT
COVER BACK TABLE 60X90IN (DRAPES) ×3 IMPLANT
COVER MAYO STAND STRL (DRAPES) ×3 IMPLANT
CUFF TOURNIQUET SINGLE 18IN (TOURNIQUET CUFF) ×3 IMPLANT
DECANTER SPIKE VIAL GLASS SM (MISCELLANEOUS) IMPLANT
DRAPE EXTREMITY T 121X128X90 (DRAPE) ×3 IMPLANT
DRAPE SURG 17X23 STRL (DRAPES) ×3 IMPLANT
DRSG PAD ABDOMINAL 8X10 ST (GAUZE/BANDAGES/DRESSINGS) ×3 IMPLANT
GAUZE SPONGE 4X4 12PLY STRL (GAUZE/BANDAGES/DRESSINGS) ×3 IMPLANT
GAUZE XEROFORM 1X8 LF (GAUZE/BANDAGES/DRESSINGS) ×3 IMPLANT
GLOVE BIOGEL PI IND STRL 7.0 (GLOVE) ×4 IMPLANT
GLOVE BIOGEL PI IND STRL 8.5 (GLOVE) ×2 IMPLANT
GLOVE BIOGEL PI INDICATOR 7.0 (GLOVE) ×2
GLOVE BIOGEL PI INDICATOR 8.5 (GLOVE) ×1
GLOVE ECLIPSE 6.5 STRL STRAW (GLOVE) ×3 IMPLANT
GLOVE SURG ORTHO 8.0 STRL STRW (GLOVE) ×3 IMPLANT
GOWN STRL REUS W/ TWL LRG LVL3 (GOWN DISPOSABLE) ×2 IMPLANT
GOWN STRL REUS W/TWL LRG LVL3 (GOWN DISPOSABLE) ×1
GOWN STRL REUS W/TWL XL LVL3 (GOWN DISPOSABLE) ×3 IMPLANT
NEEDLE PRECISIONGLIDE 27X1.5 (NEEDLE) ×3 IMPLANT
NS IRRIG 1000ML POUR BTL (IV SOLUTION) ×3 IMPLANT
PACK BASIN DAY SURGERY FS (CUSTOM PROCEDURE TRAY) ×3 IMPLANT
STOCKINETTE 4X48 STRL (DRAPES) ×3 IMPLANT
SUT ETHILON 4 0 PS 2 18 (SUTURE) ×6 IMPLANT
SUT VICRYL 4-0 PS2 18IN ABS (SUTURE) IMPLANT
SYR BULB 3OZ (MISCELLANEOUS) ×3 IMPLANT
SYR CONTROL 10ML LL (SYRINGE) ×3 IMPLANT
TOWEL OR 17X24 6PK STRL BLUE (TOWEL DISPOSABLE) ×6 IMPLANT
UNDERPAD 30X30 (UNDERPADS AND DIAPERS) ×3 IMPLANT

## 2014-11-16 NOTE — H&P (Signed)
  Kelli Moore is a 54 year old right hand dominant female  with numbness and tingling in her right hand with positive nerve conductions which were done by Dr. Zebedee Iba 05-06-13 with a motor delay of 5.8, sensory delay of 3.7, amplitude diminution of 14.7. The left side was normal at that time. She also has triggering of her thumb right hand waking her 7 out of 7 nights. She states she is tired of having this progress and desires to have this surgically released. She has had 1 injection with good relief. There is no history of diabetes, thyroid problems, arthritis or gout. She is on ibuprofen and Celebrex. She complains of intermittent, severe, throbbing, aching and burning pain with a feeling of numbness and weakness. She states it is getting worse. Activity makes it worse and rest makes it better.   PAST MEDICAL HISTORY:  She has no known drug allergies. She is on fluoxetine, Norethindrone, and Celebrex. She has had a laparoscopy and bunion surgery.  FAMILY MEDICAL HISTORY: Positive for heart disease, otherwise negative.  SOCIAL HISTORY:  She does not smoke. She drinks socially. 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  REVIEW OF SYSTEMS: Positive for weight loss, contacts, sleep disorder, otherwise negative 14 points. Kelli Moore is an 54 y.o. female.   Chief Complaint: numbness right hand catching right thumb HPI: see above  Past Medical History  Diagnosis Date  . Depression   . Carpal tunnel syndrome     right  . Trigger finger of right hand     thumb  . Anxiety     Past Surgical History  Procedure Laterality Date  . Diagnostic laparoscopy    . Bunionectomy Right     History reviewed. No pertinent family history. Social History:  reports that she has never smoked. She has never used smokeless tobacco. She reports that she drinks alcohol. She reports that she does not use illicit drugs.  Allergies: No Known Allergies  No prescriptions prior to admission    No results found for this or any  previous visit (from the past 48 hour(s)).  No results found.   Pertinent items are noted in HPI.  Height 5' 4.5" (1.638 m), weight 66.225 kg (146 lb).  General appearance: alert, cooperative and appears stated age Head: Normocephalic, without obvious abnormality Neck: no JVD Resp: clear to auscultation bilaterally Cardio: regular rate and rhythm, S1, S2 normal, no murmur, click, rub or gallop GI: soft, non-tender; bowel sounds normal; no masses,  no organomegaly Extremities: numbness right hand catching right thumb Pulses: 2+ and symmetric Skin: Skin color, texture, turgor normal. No rashes or lesions Neurologic: Grossly normal Incision/Wound: na  Assessment/Plan DIAGNOSIS: carpal tunnel syndrome right hand and triggering of her right thumb.  She would like to have this surgically released. Pre, peri and post op care are discussed along with risks and complications. Patient is aware there is no guarantee with surgery, possibility of infection, injury to arteries, nerves, and tendons, incomplete relief and dystrophy. This is scheduled as an outpatient under regional anesthesia.  Erikson Danzy R 11/16/2014, 9:24 AM

## 2014-11-16 NOTE — Anesthesia Preprocedure Evaluation (Signed)
Anesthesia Evaluation  Patient identified by MRN, date of birth, ID band Patient awake    Reviewed: Allergy & Precautions, NPO status , Patient's Chart, lab work & pertinent test results  Airway Mallampati: I  TM Distance: >3 FB Neck ROM: Full    Dental   Pulmonary    Pulmonary exam normal        Cardiovascular Normal cardiovascular exam     Neuro/Psych Anxiety Depression    GI/Hepatic   Endo/Other    Renal/GU      Musculoskeletal   Abdominal   Peds  Hematology   Anesthesia Other Findings   Reproductive/Obstetrics                             Anesthesia Physical Anesthesia Plan  ASA: II  Anesthesia Plan: Bier Block   Post-op Pain Management:    Induction: Intravenous  Airway Management Planned: Simple Face Mask  Additional Equipment:   Intra-op Plan:   Post-operative Plan:   Informed Consent: I have reviewed the patients History and Physical, chart, labs and discussed the procedure including the risks, benefits and alternatives for the proposed anesthesia with the patient or authorized representative who has indicated his/her understanding and acceptance.     Plan Discussed with: CRNA and Surgeon  Anesthesia Plan Comments:         Anesthesia Quick Evaluation

## 2014-11-16 NOTE — Discharge Instructions (Addendum)

## 2014-11-16 NOTE — Brief Op Note (Signed)
11/16/2014  12:05 PM  PATIENT:  Kelli Moore  54 y.o. female  PRE-OPERATIVE DIAGNOSIS:  RIGHT CARPAL TUNNEL SYNDROME STENOSING TENOSYNOVITIS RIGHT THUMB  POST-OPERATIVE DIAGNOSIS:  RIGHT CARPAL TUNNEL SYNDROME STENOSING TENOSYNOVITIS RIGHT THUMB  PROCEDURE:  Procedure(s): RELEASE TRIGGER FINGER/A-1 PULLEY (Right) CARPAL TUNNEL RELEASE (Right)  SURGEON:  Surgeon(s) and Role:    * Daryll Brod, MD - Primary  PHYSICIAN ASSISTANT:   ASSISTANTS: none   ANESTHESIA:   local and regional  EBL:  Total I/O In: 800 [I.V.:800] Out: -   BLOOD ADMINISTERED:none  DRAINS: none   LOCAL MEDICATIONS USED:  BUPIVICAINE   SPECIMEN:  No Specimen  DISPOSITION OF SPECIMEN:  N/A  COUNTS:  YES  TOURNIQUET:   Total Tourniquet Time Documented: Forearm (Right) - 21 minutes Total: Forearm (Right) - 21 minutes   DICTATION: .Other Dictation: Dictation Number [id number]  PLAN OF CARE: Discharge to home after PACU  PATIENT DISPOSITION:  PACU - hemodynamically stable.

## 2014-11-16 NOTE — Anesthesia Procedure Notes (Addendum)
Anesthesia Regional Block:  Bier block (IV Regional)  Pre-Anesthetic Checklist: ,, timeout performed, Correct Patient, Correct Site, Correct Laterality, Correct Procedure,, site marked, surgical consent,, at surgeon's request Needles:  Injection technique: Single-shot  Needle Type: Other      Needle Gauge: 20 and 20 G    Additional Needles: Bier block (IV Regional) Narrative:   Performed by: Personally    Procedure Name: MAC Date/Time: 11/16/2014 11:30 AM Performed by: Tansy Lorek D Pre-anesthesia Checklist: Patient identified, Emergency Drugs available, Suction available, Patient being monitored and Timeout performed Patient Re-evaluated:Patient Re-evaluated prior to inductionOxygen Delivery Method: Simple face mask

## 2014-11-16 NOTE — Anesthesia Postprocedure Evaluation (Signed)
Anesthesia Post Note  Patient: Kelli Moore  Procedure(s) Performed: Procedure(s) (LRB): RELEASE TRIGGER FINGER/A-1 PULLEY (Right) CARPAL TUNNEL RELEASE (Right)  Anesthesia type: general  Patient location: PACU  Post pain: Pain level controlled  Post assessment: Patient's Cardiovascular Status Stable  Last Vitals:  Filed Vitals:   11/16/14 1314  BP: 131/63  Pulse: 78  Temp: 36.9 C  Resp: 16    Post vital signs: Reviewed and stable  Level of consciousness: sedated  Complications: No apparent anesthesia complications

## 2014-11-16 NOTE — Transfer of Care (Signed)
Immediate Anesthesia Transfer of Care Note  Patient: Kelli Moore  Procedure(s) Performed: Procedure(s): RELEASE TRIGGER FINGER/A-1 PULLEY (Right) CARPAL TUNNEL RELEASE (Right)  Patient Location: PACU  Anesthesia Type:Bier block  Level of Consciousness: awake, alert , oriented and patient cooperative  Airway & Oxygen Therapy: Patient Spontanous Breathing and Patient connected to face mask oxygen  Post-op Assessment: Report given to RN and Post -op Vital signs reviewed and stable  Post vital signs: Reviewed and stable  Last Vitals:  Filed Vitals:   11/16/14 1021  BP: 133/68  Pulse: 92  Temp: 37.1 C  Resp: 20    Complications: No apparent anesthesia complications

## 2014-11-16 NOTE — Op Note (Signed)
Dictation Number [id number]

## 2014-11-17 ENCOUNTER — Encounter (HOSPITAL_BASED_OUTPATIENT_CLINIC_OR_DEPARTMENT_OTHER): Payer: Self-pay | Admitting: Orthopedic Surgery

## 2014-11-17 NOTE — Op Note (Signed)
NAME:  Kelli Moore, Kelli Moore                ACCOUNT NO.:  [id number]  MEDICAL RECORD NO.:  [id number]  LOCATION:                                 FACILITY:  PHYSICIAN:  Daryll Brod, M.D.       DATE OF BIRTH:  17-Apr-1960  DATE OF PROCEDURE:  11/16/2014 DATE OF DISCHARGE:                              OPERATIVE REPORT   PREOPERATIVE DIAGNOSES:  Carpal tunnel syndrome, right hand; stenosing tenosynovitis, right thumb.  POSTOPERATIVE DIAGNOSES:  Carpal tunnel syndrome, right hand; stenosing tenosynovitis, right thumb.  OPERATION:  Release of A1 pulley, right thumb; release of median nerve, right wrist.  SURGEON:  Daryll Brod, M.D.  ANESTHESIA:  Forearm-based IV regional with local infiltration.  ASSISTANT:  None.  ANESTHESIOLOGIST:  Crissie Sickles. Conrad [place], M.D.  HISTORY:  The patient is a 54 year old female with a history of carpal tunnel syndrome with triggering of her right thumb.  Nerve conductions are positive.  Neither has responded to conservative treatment.  She has elected to undergo surgical decompression of the median nerve, A1 pulley of the right thumb.  Pre, peri, and postoperative course have been discussed along with risks and complications.  She is aware that there is no guarantee with surgery, possibility of infection; recurrence of injury to arteries, nerves, tendons; incomplete relief of symptoms; and dystrophy.  In the preoperative area, she is seen.  Antibiotic given. The patient marked.  She was brought to the operating room, where forearm-based IV regional anesthetic was carried out without difficulty. She was prepped using ChloraPrep, supine position with the right arm free.  A 3-minute dry time was allowed.  Time-out taken, confirming the patient and procedure.  A transverse incision was made over the A1 pulley of the right thumb, carried down through subcutaneous tissue. Neurovascular structures were identified and protected.  The A1 pulley was found to be markedly thick,  this was released on its radial aspect taking care to protect the oblique pulley.  A synovial tissue proximally was separated with blunt dissection.  Thumb placed through a full passive range motion and no further triggering was noted.  A longitudinal incision was then made in the right palm, carried down through subcutaneous tissue.  Bleeders were again electrocauterized. Palmar fascia was split.  Superficial palmar arch identified.  Flexor tendon to the ring and little finger identified to the ulnar side of the median nerve.  Carpal retinaculum was incised with sharp dissection.  A right-angle and Sewall retractor were placed between skin and forearm fascia.  The fascia was released for approximately 3 cm proximal to the wrist crease under direct vision.  Canal was explored.  Motor branch entered into muscle distally.  Area compression to the nerve was apparent.  No further lesions were identified.  The wound was irrigated and the skin closed with interrupted 4-0 nylon sutures to the palm and to the thenar eminence.  A local infiltration with 0.25% bupivacaine without epinephrine was given, approximately 8 mL was used.  Sterile compressive dressing with the fingers free was applied.  On deflation of the tourniquet, all fingers immediately pinked.  She was taken to the recovery room for observation in a satisfactory condition.  She will be discharged home to [facility] to [facility] 1 week on [place].          ______________________________ Daryll Brod, M.D.     GK/MEDQ  D:  11/16/2014  T:  11/17/2014  Job:  [id number]

## 2014-11-18 ENCOUNTER — Ambulatory Visit
Admission: RE | Admit: 2014-11-18 | Discharge: 2014-11-18 | Disposition: A | Discharge status: Home / Self Care | Payer: Managed Care, Other (non HMO) | Source: Ambulatory Visit | Attending: Obstetrics & Gynecology | Admitting: Obstetrics & Gynecology

## 2014-11-18 DIAGNOSIS — R928 Other abnormal and inconclusive findings on diagnostic imaging of breast: Secondary | ICD-10-CM

## 2014-11-19 ENCOUNTER — Ambulatory Visit (INDEPENDENT_AMBULATORY_CARE_PROVIDER_SITE_OTHER): Payer: Managed Care, Other (non HMO) | Admitting: Podiatry

## 2014-11-19 ENCOUNTER — Ambulatory Visit (INDEPENDENT_AMBULATORY_CARE_PROVIDER_SITE_OTHER): Payer: Managed Care, Other (non HMO)

## 2014-11-19 ENCOUNTER — Encounter: Payer: Self-pay | Admitting: Podiatry

## 2014-11-19 VITALS — BP 129/72 | HR 86 | Resp 16

## 2014-11-19 DIAGNOSIS — M2011 Hallux valgus (acquired), right foot: Secondary | ICD-10-CM | POA: Diagnosis not present

## 2014-11-19 DIAGNOSIS — Z9889 Other specified postprocedural states: Secondary | ICD-10-CM

## 2014-11-19 NOTE — Addendum Note (Signed)
Addendum  created 11/19/14 1505 by Lillia Abed, MD   Modules edited: Notes Section   Notes Section:  File: [id number]

## 2014-11-19 NOTE — Anesthesia Postprocedure Evaluation (Signed)
Anesthesia Post Note  Patient: Kelli Moore  Procedure(s) Performed: Procedure(s) (LRB): RELEASE TRIGGER FINGER/A-1 PULLEY (Right) CARPAL TUNNEL RELEASE (Right)  Anesthesia type: Bier Block  Patient location: PACU  Post pain: Pain level controlled  Post assessment: Patient's Cardiovascular Status Stable  Last Vitals:  Filed Vitals:   11/16/14 1314  BP: 131/63  Pulse: 78  Temp: 36.9 C  Resp: 16    Post vital signs: Reviewed and stable  Level of consciousness: awake  Complications: No apparent anesthesia complications

## 2014-11-22 NOTE — Progress Notes (Signed)
Subjective:     Patient ID: Kelli Moore, female   DOB: 04-16-1960, 54 y.o.   MRN: [id number]  HPI patient states I'm doing well with my right foot but if I'm on it too much it can get sore   Review of Systems     Objective:   Physical Exam Neurovascular status intact muscle strength adequate with wound edges coapted well first metatarsal right with good alignment of the joint noted good range of motion with no crepitus    Assessment:     Doing well post surgery of the right foot    Plan:     X-rays were reviewed and at this time I advised on continued elevation and compression at times and dispensed anklet with gradual return to soft shoes and also range of motion exercises. Reappoint 4 weeks or earlier if any issues should occur

## 2014-12-13 DIAGNOSIS — M65311 Trigger thumb, right thumb: Secondary | ICD-10-CM | POA: Insufficient documentation

## 2014-12-13 DIAGNOSIS — G5601 Carpal tunnel syndrome, right upper limb: Secondary | ICD-10-CM | POA: Insufficient documentation

## 2015-06-22 DIAGNOSIS — L03114 Cellulitis of left upper limb: Secondary | ICD-10-CM | POA: Insufficient documentation

## 2015-06-22 DIAGNOSIS — W5501XA Bitten by cat, initial encounter: Secondary | ICD-10-CM | POA: Insufficient documentation

## 2015-06-22 DIAGNOSIS — F329 Major depressive disorder, single episode, unspecified: Secondary | ICD-10-CM | POA: Insufficient documentation

## 2015-09-12 DIAGNOSIS — M654 Radial styloid tenosynovitis [de Quervain]: Secondary | ICD-10-CM | POA: Insufficient documentation

## 2015-10-31 ENCOUNTER — Other Ambulatory Visit: Payer: Self-pay | Admitting: Obstetrics & Gynecology

## 2015-10-31 DIAGNOSIS — Z1231 Encounter for screening mammogram for malignant neoplasm of breast: Secondary | ICD-10-CM

## 2015-11-22 ENCOUNTER — Ambulatory Visit
Admission: RE | Admit: 2015-11-22 | Discharge: 2015-11-22 | Disposition: A | Discharge status: Home / Self Care | Payer: 59 | Source: Ambulatory Visit | Attending: Obstetrics & Gynecology | Admitting: Obstetrics & Gynecology

## 2015-11-22 DIAGNOSIS — Z1231 Encounter for screening mammogram for malignant neoplasm of breast: Secondary | ICD-10-CM

## 2016-07-04 ENCOUNTER — Other Ambulatory Visit: Payer: Self-pay

## 2016-07-04 DIAGNOSIS — R002 Palpitations: Secondary | ICD-10-CM

## 2016-07-04 NOTE — Progress Notes (Signed)
Patient requested to have Calcium score at office visit for her father. Order placed per Dr. Johnsie Cancel.

## 2016-07-17 ENCOUNTER — Ambulatory Visit (INDEPENDENT_AMBULATORY_CARE_PROVIDER_SITE_OTHER)
Admission: RE | Admit: 2016-07-17 | Discharge: 2016-07-17 | Disposition: A | Discharge status: Home / Self Care | Payer: Self-pay | Source: Ambulatory Visit | Attending: Cardiovascular Disease | Admitting: Cardiovascular Disease

## 2016-07-17 ENCOUNTER — Encounter: Payer: Self-pay | Admitting: Radiology

## 2016-07-17 DIAGNOSIS — R002 Palpitations: Secondary | ICD-10-CM

## 2016-10-15 ENCOUNTER — Other Ambulatory Visit: Payer: Self-pay | Admitting: Obstetrics & Gynecology

## 2016-10-15 DIAGNOSIS — Z1231 Encounter for screening mammogram for malignant neoplasm of breast: Secondary | ICD-10-CM

## 2016-11-09 ENCOUNTER — Other Ambulatory Visit: Payer: Self-pay | Admitting: Obstetrics & Gynecology

## 2016-11-13 NOTE — Telephone Encounter (Signed)
Has CE scheduled with Dr. Dellis Filbert on 11/19/16.

## 2016-11-19 ENCOUNTER — Ambulatory Visit (INDEPENDENT_AMBULATORY_CARE_PROVIDER_SITE_OTHER): Payer: BLUE CROSS/BLUE SHIELD | Admitting: Obstetrics & Gynecology

## 2016-11-19 ENCOUNTER — Other Ambulatory Visit: Payer: Self-pay | Admitting: Gynecology

## 2016-11-19 ENCOUNTER — Encounter: Payer: Self-pay | Admitting: Obstetrics & Gynecology

## 2016-11-19 VITALS — BP 140/88 | Ht 64.0 in | Wt 156.0 lb

## 2016-11-19 DIAGNOSIS — Z8041 Family history of malignant neoplasm of ovary: Secondary | ICD-10-CM | POA: Diagnosis not present

## 2016-11-19 DIAGNOSIS — N952 Postmenopausal atrophic vaginitis: Secondary | ICD-10-CM

## 2016-11-19 DIAGNOSIS — Z01419 Encounter for gynecological examination (general) (routine) without abnormal findings: Secondary | ICD-10-CM

## 2016-11-19 DIAGNOSIS — Z23 Encounter for immunization: Secondary | ICD-10-CM | POA: Diagnosis not present

## 2016-11-19 DIAGNOSIS — Z780 Asymptomatic menopausal state: Secondary | ICD-10-CM | POA: Diagnosis not present

## 2016-11-19 DIAGNOSIS — Z13820 Encounter for screening for osteoporosis: Secondary | ICD-10-CM

## 2016-11-19 MED ORDER — ESTRADIOL 10 MCG VA TABS: 1.0000 | ORAL_TABLET | VAGINAL | 11 refills | Status: DC

## 2016-11-19 MED ORDER — ESTRADIOL 10 MCG VA TABS: 1.0000 | ORAL_TABLET | VAGINAL | 4 refills | Status: DC

## 2016-11-19 NOTE — Addendum Note (Signed)
Addended by: Thurnell Garbe A on: 11/19/2016 03:15 PM   Modules accepted: Orders

## 2016-11-19 NOTE — Patient Instructions (Signed)
1. Encounter for routine gynecological examination with Papanicolaou smear of cervix Normal gyn exam with Atrophic Vaginitis.  Pap reflex done.  Breasts wnl.  Scheduled screening Mammo at the [facility] ([facility]) - TSH - Lipid Profile - Vitamin D 1,25 dihydroxy  2. Menopause present No HRT.  No PMB.  Vit D/Ca++/Weight bearing physical activity.  F/U Bone Density. - Vitamin D 1,25 dihydroxy - DG Bone Density; Future  3. Post-menopausal atrophic vaginitis Vagifem prescribed.  Usage reviewed.    4. Family history of ovarian cancer and colon cancer Refer to [employer].  Kelli Moore, it was a pleasure seeing you today!  I will inform you of your results as soon as available.   Health Maintenance for Postmenopausal Women Menopause is a normal process in which your reproductive ability comes to an end. This process happens gradually over a span of months to years, usually between the ages of 50 and 104. Menopause is complete when you have missed 12 consecutive menstrual periods. It is important to talk with your health care provider about some of the most common conditions that affect postmenopausal women, such as heart disease, cancer, and bone loss (osteoporosis). Adopting a healthy lifestyle and getting preventive care can help to promote your health and wellness. Those actions can also lower your chances of developing some of these common conditions. What should I know about menopause? During menopause, you may experience a number of symptoms, such as:  Moderate-to-severe hot flashes.  Night sweats.  Decrease in sex drive.  Mood swings.  Headaches.  Tiredness.  Irritability.  Memory problems.  Insomnia.  Choosing to treat or not to treat menopausal changes is an individual decision that you make with your health care provider. What should I know about hormone replacement therapy and supplements? Hormone therapy products are effective for treating  symptoms that are associated with menopause, such as hot flashes and night sweats. Hormone replacement carries certain risks, especially as you become older. If you are thinking about using estrogen or estrogen with progestin treatments, discuss the benefits and risks with your health care provider. What should I know about heart disease and stroke? Heart disease, heart attack, and stroke become more likely as you age. This may be due, in part, to the hormonal changes that your body experiences during menopause. These can affect how your body processes dietary fats, triglycerides, and cholesterol. Heart attack and stroke are both medical emergencies. There are many things that you can do to help prevent heart disease and stroke:  Have your blood pressure checked at least every 1-2 years. High blood pressure causes heart disease and increases the risk of stroke.  If you are 54-31 years old, ask your health care provider if you should take aspirin to prevent a heart attack or a stroke.  Do not use any tobacco products, including cigarettes, chewing tobacco, or electronic cigarettes. If you need help quitting, ask your health care provider.  It is important to eat a healthy diet and maintain a healthy weight. ? Be sure to include plenty of vegetables, fruits, low-fat dairy products, and lean protein. ? Avoid eating foods that are high in solid fats, added sugars, or salt (sodium).  Get regular exercise. This is one of the most important things that you can do for your health. ? Try to exercise for at least 150 minutes each week. The type of exercise that you do should increase your heart rate and make you sweat. This is known  as moderate-intensity exercise. ? Try to do strengthening exercises at least twice each week. Do these in addition to the moderate-intensity exercise.  Know your numbers.Ask your health care provider to check your cholesterol and your blood glucose. Continue to have your blood  tested as directed by your health care provider.  What should I know about cancer screening? There are several types of cancer. Take the following steps to reduce your risk and to catch any cancer development as early as possible. Breast Cancer  Practice breast self-awareness. ? This means understanding how your breasts normally appear and feel. ? It also means doing regular breast self-exams. Let your health care provider know about any changes, no matter how small.  If you are 23 or older, have a clinician do a breast exam (clinical breast exam or CBE) every year. Depending on your age, family history, and medical history, it may be recommended that you also have a yearly breast X-ray (mammogram).  If you have a family history of breast cancer, talk with your health care provider about genetic screening.  If you are at high risk for breast cancer, talk with your health care provider about having an MRI and a mammogram every year.  Breast cancer (BRCA) gene test is recommended for women who have family members with BRCA-related cancers. Results of the assessment will determine the need for genetic counseling and BRCA1 and for BRCA2 testing. BRCA-related cancers include these types: ? Breast. This occurs in males or females. ? Ovarian. ? Tubal. This may also be called fallopian tube cancer. ? Cancer of the abdominal or pelvic lining (peritoneal cancer). ? Prostate. ? Pancreatic.  Cervical, Uterine, and Ovarian Cancer Your health care provider may recommend that you be screened regularly for cancer of the pelvic organs. These include your ovaries, uterus, and vagina. This screening involves a pelvic exam, which includes checking for microscopic changes to the surface of your cervix (Pap test).  For women ages 21-65, health care providers may recommend a pelvic exam and a Pap test every three years. For women ages 52-65, they may recommend the Pap test and pelvic exam, combined with testing  for human papilloma virus (HPV), every five years. Some types of HPV increase your risk of cervical cancer. Testing for HPV may also be done on women of any age who have unclear Pap test results.  Other health care providers may not recommend any screening for nonpregnant women who are considered low risk for pelvic cancer and have no symptoms. Ask your health care provider if a screening pelvic exam is right for you.  If you have had past treatment for cervical cancer or a condition that could lead to cancer, you need Pap tests and screening for cancer for at least 20 years after your treatment. If Pap tests have been discontinued for you, your risk factors (such as having a new sexual partner) need to be reassessed to determine if you should start having screenings again. Some women have medical problems that increase the chance of getting cervical cancer. In these cases, your health care provider may recommend that you have screening and Pap tests more often.  If you have a family history of uterine cancer or ovarian cancer, talk with your health care provider about genetic screening.  If you have vaginal bleeding after reaching menopause, tell your health care provider.  There are currently no reliable tests available to screen for ovarian cancer.  Lung Cancer Lung cancer screening is recommended for adults 55-80  years old who are at high risk for lung cancer because of a history of smoking. A yearly low-dose CT scan of the lungs is recommended if you:  Currently smoke.  Have a history of at least 30 pack-years of smoking and you currently smoke or have quit within the past 15 years. A pack-year is smoking an average of one pack of cigarettes per day for one year.  Yearly screening should:  Continue until it has been 15 years since you quit.  Stop if you develop a health problem that would prevent you from having lung cancer treatment.  Colorectal Cancer  This type of cancer can be  detected and can often be prevented.  Routine colorectal cancer screening usually begins at age 32 and continues through age 27.  If you have risk factors for colon cancer, your health care provider may recommend that you be screened at an earlier age.  If you have a family history of colorectal cancer, talk with your health care provider about genetic screening.  Your health care provider may also recommend using home test kits to check for hidden blood in your stool.  A small camera at the end of a tube can be used to examine your colon directly (sigmoidoscopy or colonoscopy). This is done to check for the earliest forms of colorectal cancer.  Direct examination of the colon should be repeated every 5-10 years until age 82. However, if early forms of precancerous polyps or small growths are found or if you have a family history or genetic risk for colorectal cancer, you may need to be screened more often.  Skin Cancer  Check your skin from head to toe regularly.  Monitor any moles. Be sure to tell your health care provider: ? About any new moles or changes in moles, especially if there is a change in a mole's shape or color. ? If you have a mole that is larger than the size of a pencil eraser.  If any of your family members has a history of skin cancer, especially at a young age, talk with your health care provider about genetic screening.  Always use sunscreen. Apply sunscreen liberally and repeatedly throughout the day.  Whenever you are outside, protect yourself by wearing long sleeves, pants, a wide-brimmed hat, and sunglasses.  What should I know about osteoporosis? Osteoporosis is a condition in which bone destruction happens more quickly than new bone creation. After menopause, you may be at an increased risk for osteoporosis. To help prevent osteoporosis or the bone fractures that can happen because of osteoporosis, the following is recommended:  If you are 35-3 years old,  get at least 1,000 mg of calcium and at least 600 mg of vitamin D per day.  If you are older than age 74 but younger than age 91, get at least 1,200 mg of calcium and at least 600 mg of vitamin D per day.  If you are older than age 54, get at least 1,200 mg of calcium and at least 800 mg of vitamin D per day.  Smoking and excessive alcohol intake increase the risk of osteoporosis. Eat foods that are rich in calcium and vitamin D, and do weight-bearing exercises several times each week as directed by your health care provider. What should I know about how menopause affects my mental health? Depression may occur at any age, but it is more common as you become older. Common symptoms of depression include:  Low or sad mood.  Changes in  sleep patterns.  Changes in appetite or eating patterns.  Feeling an overall lack of motivation or enjoyment of activities that you previously enjoyed.  Frequent crying spells.  Talk with your health care provider if you think that you are experiencing depression. What should I know about immunizations? It is important that you get and maintain your immunizations. These include:  Tetanus, diphtheria, and pertussis (Tdap) booster vaccine.  Influenza every year before the flu season begins.  Pneumonia vaccine.  Shingles vaccine.  Your health care provider may also recommend other immunizations. This information is not intended to replace advice given to you by your health care provider. Make sure you discuss any questions you have with your health care provider. Document Released: 03/02/2005 Document Revised: 07/29/2015 Document Reviewed: 10/12/2014 Elsevier Interactive Patient Education  2018 [employer].

## 2016-11-19 NOTE — Progress Notes (Signed)
Kelli Moore Dec 26, 1960 [id number]   History:    56 y.o. 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  No HRT.  No PMB.  Vaginal dryness and worsening pain with IC.  No pelvic pain.  Breasts wnl.  Mictions/BMs wnl.  BMI 26.78, planning more physical activity/low carb-calorie diet.  Past medical history,surgical history, family history and social history were all reviewed and documented in the EPIC chart.  Gynecologic History Patient's last menstrual period was 09/14/2010. Contraception: post menopausal status Last Pap: 2017. Results were: Neg/HPV HR neg Last mammogram: 10/2015. Results were: Negative No recent Bone Density [place] 2015  Obstetric History OB History  Gravida Para Term Preterm AB Living  0 0 0 0 0 0  SAB TAB Ectopic Multiple Live Births  0 0 0 0 0         ROS: A ROS was performed and pertinent positives and negatives are included in the history.  GENERAL: No fevers or chills. HEENT: No change in vision, no earache, sore throat or sinus congestion. NECK: No pain or stiffness. CARDIOVASCULAR: No chest pain or pressure. No palpitations. PULMONARY: No shortness of breath, cough or wheeze. GASTROINTESTINAL: No abdominal pain, nausea, vomiting or diarrhea, melena or bright red blood per rectum. GENITOURINARY: No urinary frequency, urgency, hesitancy or dysuria. MUSCULOSKELETAL: No joint or muscle pain, no back pain, no recent trauma. DERMATOLOGIC: No rash, no itching, no lesions. ENDOCRINE: No polyuria, polydipsia, no heat or cold intolerance. No recent change in weight. HEMATOLOGICAL: No anemia or easy bruising or bleeding. NEUROLOGIC: No headache, seizures, numbness, tingling or weakness. PSYCHIATRIC: No depression, no loss of interest in normal activity or change in sleep pattern.     Exam:   BP 140/88   Ht 5' 4"  (1.626 m)   Wt 156 lb (70.8 kg)   LMP 09/14/2010   BMI 26.78 kg/m    Body mass index is 26.78 kg/m.  General appearance : Well developed well nourished female. No acute distress HEENT: Eyes: no retinal hemorrhage or exudates,  Neck supple, trachea midline, no carotid bruits, no thyroidmegaly Lungs: Clear to auscultation, no rhonchi or wheezes, or rib retractions  Heart: Regular rate and rhythm, no murmurs or gallops Breast:Examined in sitting and supine position were symmetrical in appearance, no palpable masses or tenderness,  no skin retraction, no nipple inversion, no nipple discharge, no skin discoloration, no axillary or supraclavicular lymphadenopathy Abdomen: no palpable masses or tenderness, no rebound or guarding Extremities: no edema or skin discoloration or tenderness  Pelvic: Vulva normal  Bartholin, Urethra, Skene Glands: Within normal limits             Vagina: No gross lesions or discharge  Cervix: No gross lesions or discharge.  Pap reflex done.  Uterus  AV, normal size, shape and consistency, non-tender and mobile  Adnexa  Without masses or tenderness  Anus and perineum  normal    Assessment/Plan:  56 y.o. female for annual exam   1. Encounter for routine gynecological examination with Papanicolaou smear of cervix Normal gyn exam with Atrophic Vaginitis.  Pap reflex done.  Breasts wnl.  Scheduled screening Mammo at the [facility] ([facility]) - TSH - Lipid Profile - Vitamin D 1,25 dihydroxy  2. Menopause present No HRT.  No PMB.  Vit D/Ca++/Weight bearing physical activity.  F/U Bone Density. - Vitamin D 1,25 dihydroxy - DG Bone Density; Future  3. Post-menopausal atrophic vaginitis Vagifem prescribed.  Usage reviewed.    4. Family history of ovarian cancer and colon cancer Refer to [employer].  Counseling on above issues >50% x 10 minutes.  Princess Bruins MD, 2:17 PM 11/19/2016

## 2016-11-20 ENCOUNTER — Telehealth: Payer: Self-pay

## 2016-11-20 ENCOUNTER — Other Ambulatory Visit: Payer: BLUE CROSS/BLUE SHIELD

## 2016-11-20 ENCOUNTER — Telehealth: Payer: Self-pay | Admitting: *Deleted

## 2016-11-20 DIAGNOSIS — Z8041 Family history of malignant neoplasm of ovary: Secondary | ICD-10-CM

## 2016-11-20 DIAGNOSIS — Z800 Family history of malignant neoplasm of digestive organs: Secondary | ICD-10-CM

## 2016-11-20 NOTE — Telephone Encounter (Signed)
Referral placed Cancer center will contact pt to schedule.

## 2016-11-20 NOTE — Telephone Encounter (Signed)
Re: Estradiol 10 mcg Vaginal Tablet  Pharmacy sent a note: " Dr. Dellis Filbert- Due to high deductible insurance-this is very expensive.  Patient would like to try a compounded Estradiol Vaginal Cream. We compound cream as 0.02% Estradiol (apply one ml per dose).  Ok to try compound product?  Please send a new order. Thanks

## 2016-11-20 NOTE — Telephone Encounter (Signed)
-----   Message from Princess Bruins, MD sent at 11/19/2016  2:53 PM EDT ----- Regarding: Refer to Genetic counselor Fam h/o Ovarian and Colon Cancer.

## 2016-11-21 LAB — PAP IG W/ RFLX HPV ASCU

## 2016-11-22 ENCOUNTER — Ambulatory Visit (INDEPENDENT_AMBULATORY_CARE_PROVIDER_SITE_OTHER): Payer: BLUE CROSS/BLUE SHIELD | Admitting: Podiatry

## 2016-11-22 ENCOUNTER — Other Ambulatory Visit: Payer: BLUE CROSS/BLUE SHIELD

## 2016-11-22 ENCOUNTER — Ambulatory Visit (INDEPENDENT_AMBULATORY_CARE_PROVIDER_SITE_OTHER): Payer: BLUE CROSS/BLUE SHIELD

## 2016-11-22 ENCOUNTER — Encounter: Payer: Self-pay | Admitting: Podiatry

## 2016-11-22 DIAGNOSIS — M2010 Hallux valgus (acquired), unspecified foot: Secondary | ICD-10-CM | POA: Diagnosis not present

## 2016-11-22 NOTE — Patient Instructions (Signed)

## 2016-11-23 MED ORDER — NONFORMULARY OR COMPOUNDED ITEM: 3 refills | Status: DC

## 2016-11-23 NOTE — Progress Notes (Signed)
Subjective:    Patient ID: Kelli Moore, female   DOB: 56 y.o.   MRN: [id number]   HPI patient states that she's ready to get her left foot fixed and is very satisfied with the right foot    ROS      Objective:  Physical Exam neurovascular status intact with patient noted to have significant structural bunion deformity left with redness pain and deviation the hallux against the second toe. 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 I do think distal osteotomy can be done and I reviewed case and condition and discussed alternative treatments complications as outlined on the consent form which she read. Patient wants surgery and after extensive review signs consent form and is given all preoperative instructions and is scheduled for outpatient surgery. Patient is encouraged to call with any questions prior to procedure and did have air fracture walker dispensed today with instructions on usage and does understand total recovery is approximate 6 months to one year for this procedure  X-ray report indicates elevation of the intermetatarsal angle of significant nature with deviation the hallux and sesamoidal shift

## 2016-11-23 NOTE — Telephone Encounter (Signed)
Rx sent 

## 2016-11-23 NOTE — Telephone Encounter (Signed)
Agree with compound Estradiol cream.

## 2016-11-25 LAB — COMPREHENSIVE METABOLIC PANEL
AG RATIO: 1.6 (calc) (ref 1.0–2.5)
ALT: 17 U/L (ref 6–29)
AST: 21 U/L (ref 10–35)
Albumin: 4.2 g/dL (ref 3.6–5.1)
Alkaline phosphatase (APISO): 73 U/L (ref 33–130)
BILIRUBIN TOTAL: 0.5 mg/dL (ref 0.2–1.2)
BUN: 14 mg/dL (ref 7–25)
CALCIUM: 9.1 mg/dL (ref 8.6–10.4)
CO2: 28 mmol/L (ref 20–32)
Chloride: 103 mmol/L (ref 98–110)
Creat: 0.6 mg/dL (ref 0.50–1.05)
GLUCOSE: 85 mg/dL (ref 65–99)
Globulin: 2.7 g/dL (calc) (ref 1.9–3.7)
Potassium: 4 mmol/L (ref 3.5–5.3)
Sodium: 140 mmol/L (ref 135–146)
Total Protein: 6.9 g/dL (ref 6.1–8.1)

## 2016-11-25 LAB — LIPID PANEL
CHOLESTEROL: 203 mg/dL — AB (ref <200)
HDL: 93 mg/dL (ref >50)
LDL Cholesterol (Calc): 96 mg/dL (calc)
Non-HDL Cholesterol (Calc): 110 mg/dL (calc) (ref <130)
TRIGLYCERIDES: 59 mg/dL (ref <150)
Total CHOL/HDL Ratio: 2.2 (calc) (ref <5.0)

## 2016-11-25 LAB — VITAMIN D 1,25 DIHYDROXY
VITAMIN D 1, 25 (OH) TOTAL: 22 pg/mL (ref 18–72)
VITAMIN D3 1, 25 (OH): 22 pg/mL

## 2016-11-25 LAB — CBC
HCT: 36.7 % (ref 35.0–45.0)
HEMOGLOBIN: 12.7 g/dL (ref 11.7–15.5)
MCH: 31.6 pg (ref 27.0–33.0)
MCHC: 34.6 g/dL (ref 32.0–36.0)
MCV: 91.3 fL (ref 80.0–100.0)
MPV: 9.4 fL (ref 7.5–12.5)
Platelets: 288 10*3/uL (ref 140–400)
RBC: 4.02 10*6/uL (ref 3.80–5.10)
RDW: 11.7 % (ref 11.0–15.0)
WBC: 4.6 10*3/uL (ref 3.8–10.8)

## 2016-11-25 LAB — TSH: TSH: 1.1 mIU/L

## 2016-11-29 NOTE — Telephone Encounter (Signed)
Appointment 01/31/17 @ 3:00pm

## 2016-12-03 ENCOUNTER — Ambulatory Visit: Payer: BLUE CROSS/BLUE SHIELD

## 2016-12-03 ENCOUNTER — Ambulatory Visit (INDEPENDENT_AMBULATORY_CARE_PROVIDER_SITE_OTHER): Payer: BLUE CROSS/BLUE SHIELD

## 2016-12-03 DIAGNOSIS — Z13820 Encounter for screening for osteoporosis: Secondary | ICD-10-CM | POA: Diagnosis not present

## 2016-12-04 ENCOUNTER — Encounter: Payer: Self-pay | Admitting: Gynecology

## 2016-12-10 ENCOUNTER — Ambulatory Visit
Admission: RE | Admit: 2016-12-10 | Discharge: 2016-12-10 | Disposition: A | Discharge status: Home / Self Care | Payer: BLUE CROSS/BLUE SHIELD | Source: Ambulatory Visit | Attending: Obstetrics & Gynecology | Admitting: Obstetrics & Gynecology

## 2016-12-10 DIAGNOSIS — Z1231 Encounter for screening mammogram for malignant neoplasm of breast: Secondary | ICD-10-CM

## 2016-12-12 ENCOUNTER — Other Ambulatory Visit: Payer: Self-pay | Admitting: Obstetrics & Gynecology

## 2016-12-12 DIAGNOSIS — R928 Other abnormal and inconclusive findings on diagnostic imaging of breast: Secondary | ICD-10-CM

## 2016-12-19 ENCOUNTER — Other Ambulatory Visit: Payer: Self-pay | Admitting: Obstetrics & Gynecology

## 2016-12-19 ENCOUNTER — Other Ambulatory Visit: Payer: BLUE CROSS/BLUE SHIELD

## 2017-01-14 ENCOUNTER — Telehealth: Payer: Self-pay | Admitting: Genetics

## 2017-01-14 NOTE — Telephone Encounter (Signed)
Called patient and left message on voice mail with date/time/location/ph#

## 2017-01-29 DIAGNOSIS — M2012 Hallux valgus (acquired), left foot: Secondary | ICD-10-CM | POA: Diagnosis not present

## 2017-01-29 HISTORY — PX: METATARSAL OSTEOTOMY WITH BUNIONECTOMY: SHX5662

## 2017-01-31 ENCOUNTER — Telehealth: Payer: Self-pay | Admitting: *Deleted

## 2017-01-31 ENCOUNTER — Telehealth: Payer: Self-pay | Admitting: Podiatry

## 2017-01-31 ENCOUNTER — Ambulatory Visit (INDEPENDENT_AMBULATORY_CARE_PROVIDER_SITE_OTHER): Payer: BLUE CROSS/BLUE SHIELD | Admitting: Podiatry

## 2017-01-31 ENCOUNTER — Encounter: Payer: BLUE CROSS/BLUE SHIELD | Admitting: Genetics

## 2017-01-31 DIAGNOSIS — M2010 Hallux valgus (acquired), unspecified foot: Secondary | ICD-10-CM

## 2017-01-31 NOTE — Telephone Encounter (Signed)
Pt presented to office stating the surgical dressing on the left foot had blood on it. I removed the cam boot, open-ended sock and ace wrap only, and evaluated the gauze dressing. There was a quarter-size area of dried brownish red blood at the base of the left great toe. I informed pt and her husband, it was not unusual, it was sterile and would perform as a splint for positioning and protection. I marked the quarter-size area with a Sharpie and told them if if began to bleed again they would be able to see as the bleeding went outside of the marked area. I reapplied the ace beginning at the toes and wrapped past the ankle, covered with a fresh stockingette and reapplied the cam boot and added an additional strap which the boot was missing. I told pt she should sleep and walk in the boot, but if resting could remove. Pt asked if she would be able to go on a shopping trip with a client next week. I told pt by next week DR. Regal may release her to weight bear for about 30 minutes per hour, I could write her for a knee scooter, but the foot would still be below her heart and may increase swelling which may increase pain. Pt states she will have the client wait a few weeks.

## 2017-01-31 NOTE — Telephone Encounter (Signed)
I had a bunionectomy surgery on Tuesday morning. That night I noticed the bandage that was put on at the surgery center is really bloody underneath. I was wondering if for purposes being clean with the incision do I need to come in today to have a new bandage put on? I had a lot of pain yesterday but it has eased off. If you would please give me a call when you get a chance at [phone]. Thank you.

## 2017-02-04 NOTE — Progress Notes (Signed)
Post operative patient foot re-wrapped by V. Honor Junes, RN

## 2017-02-05 ENCOUNTER — Other Ambulatory Visit: Payer: Self-pay | Admitting: Obstetrics & Gynecology

## 2017-02-05 ENCOUNTER — Ambulatory Visit
Admission: RE | Admit: 2017-02-05 | Discharge: 2017-02-05 | Disposition: A | Discharge status: Home / Self Care | Payer: BLUE CROSS/BLUE SHIELD | Source: Ambulatory Visit | Attending: Obstetrics & Gynecology | Admitting: Obstetrics & Gynecology

## 2017-02-05 DIAGNOSIS — R928 Other abnormal and inconclusive findings on diagnostic imaging of breast: Secondary | ICD-10-CM

## 2017-02-05 DIAGNOSIS — N6489 Other specified disorders of breast: Secondary | ICD-10-CM

## 2017-02-06 ENCOUNTER — Encounter: Payer: Self-pay | Admitting: Podiatry

## 2017-02-06 ENCOUNTER — Ambulatory Visit (INDEPENDENT_AMBULATORY_CARE_PROVIDER_SITE_OTHER): Payer: BLUE CROSS/BLUE SHIELD | Admitting: Podiatry

## 2017-02-06 ENCOUNTER — Ambulatory Visit (INDEPENDENT_AMBULATORY_CARE_PROVIDER_SITE_OTHER): Payer: BLUE CROSS/BLUE SHIELD

## 2017-02-06 VITALS — BP 135/79 | HR 79 | Temp 97.3°F

## 2017-02-06 DIAGNOSIS — M2010 Hallux valgus (acquired), unspecified foot: Secondary | ICD-10-CM

## 2017-02-06 NOTE — Progress Notes (Signed)
DOS 01/30/2016 [facility] Bunionectomy LT

## 2017-02-07 NOTE — Progress Notes (Signed)
Subjective:   Patient ID: Kelli Moore, female   DOB: 57 y.o.   MRN: [id number]   HPI Patient states doing really well with minimal discomfort   ROS      Objective:  Physical Exam  Neurovascular status intact with good alignment of the first MPJ with good range of motion wound edges well coapted     Assessment:  Doing well postoperatively with no indication of pathology     Plan:  At this time went ahead and I recommended conservative care and reapplied sterile dressing instructed continued elevation compression immobilization and reappoint 3 weeks or earlier if needed  X-rays indicate that the osteotomy is healing well with good alignment fixation in place and good reduction of ankle

## 2017-02-08 ENCOUNTER — Ambulatory Visit
Admission: RE | Admit: 2017-02-08 | Discharge: 2017-02-08 | Disposition: A | Discharge status: Home / Self Care | Payer: BLUE CROSS/BLUE SHIELD | Source: Ambulatory Visit | Attending: Obstetrics & Gynecology | Admitting: Obstetrics & Gynecology

## 2017-02-08 ENCOUNTER — Other Ambulatory Visit: Payer: Self-pay | Admitting: Obstetrics & Gynecology

## 2017-02-08 DIAGNOSIS — N6489 Other specified disorders of breast: Secondary | ICD-10-CM

## 2017-02-08 HISTORY — PX: BREAST BIOPSY: SHX20

## 2017-02-18 ENCOUNTER — Encounter: Payer: BLUE CROSS/BLUE SHIELD | Admitting: Genetics

## 2017-02-21 ENCOUNTER — Encounter: Payer: BLUE CROSS/BLUE SHIELD | Admitting: Genetics

## 2017-02-21 ENCOUNTER — Other Ambulatory Visit: Payer: BLUE CROSS/BLUE SHIELD

## 2017-02-22 DIAGNOSIS — N6310 Unspecified lump in the right breast, unspecified quadrant: Secondary | ICD-10-CM

## 2017-02-22 HISTORY — PX: BREAST SURGERY: SHX581

## 2017-02-22 HISTORY — DX: Unspecified lump in the right breast, unspecified quadrant: N63.10

## 2017-02-25 ENCOUNTER — Ambulatory Visit: Payer: Self-pay | Admitting: General Surgery

## 2017-02-25 ENCOUNTER — Other Ambulatory Visit: Payer: Self-pay | Admitting: General Surgery

## 2017-02-25 DIAGNOSIS — N6310 Unspecified lump in the right breast, unspecified quadrant: Secondary | ICD-10-CM

## 2017-02-27 ENCOUNTER — Ambulatory Visit (INDEPENDENT_AMBULATORY_CARE_PROVIDER_SITE_OTHER): Payer: BLUE CROSS/BLUE SHIELD | Admitting: Podiatry

## 2017-02-27 ENCOUNTER — Encounter: Payer: Self-pay | Admitting: Podiatry

## 2017-02-27 ENCOUNTER — Ambulatory Visit (INDEPENDENT_AMBULATORY_CARE_PROVIDER_SITE_OTHER): Payer: BLUE CROSS/BLUE SHIELD

## 2017-02-27 DIAGNOSIS — M2010 Hallux valgus (acquired), unspecified foot: Secondary | ICD-10-CM

## 2017-02-27 DIAGNOSIS — Z472 Encounter for removal of internal fixation device: Secondary | ICD-10-CM

## 2017-02-27 NOTE — Progress Notes (Signed)
Subjective:   Patient ID: Kelli Moore, female   DOB: 57 y.o.   MRN: [id number]   HPI Patient presents stating she is doing very well with minimal discomfort and has been able to get back into shoes   ROS      Objective:  Physical Exam  Neurovascular status intact negative Homans sign noted with patient's left foot healing well with wound edges well coapted good alignment of the first metatarsal and excellent range of motion with no crepitus     Assessment:  Doing well post osteotomy first metatarsal left     Plan:  X-ray reviewed and discussed that the proximal pin is slightly elevated and may need to be removed in future.  Other than that continue range of motion continued compression elevation and reappoint 6 weeks to reevaluate  X-ray indicates the osteotomy itself is healing well with the proximal pin having moved into dorsal direction and may give her problems at one point future

## 2017-03-07 ENCOUNTER — Encounter (HOSPITAL_BASED_OUTPATIENT_CLINIC_OR_DEPARTMENT_OTHER): Payer: Self-pay | Admitting: *Deleted

## 2017-03-07 ENCOUNTER — Other Ambulatory Visit: Payer: Self-pay

## 2017-03-07 NOTE — Pre-Procedure Instructions (Signed)
To come pick up Ensure pre-surgery drink 10 oz. - to drink by 0400 DOS. 

## 2017-03-12 NOTE — Progress Notes (Signed)
Patient here for ensure pre surgery drink, instruction given to drink by 0400,verbalized understanding without questions.

## 2017-03-13 ENCOUNTER — Other Ambulatory Visit: Payer: Self-pay | Admitting: Podiatry

## 2017-03-13 ENCOUNTER — Ambulatory Visit (INDEPENDENT_AMBULATORY_CARE_PROVIDER_SITE_OTHER): Payer: BLUE CROSS/BLUE SHIELD | Admitting: Podiatry

## 2017-03-13 ENCOUNTER — Ambulatory Visit (INDEPENDENT_AMBULATORY_CARE_PROVIDER_SITE_OTHER): Payer: BLUE CROSS/BLUE SHIELD

## 2017-03-13 ENCOUNTER — Encounter: Payer: Self-pay | Admitting: Podiatry

## 2017-03-13 DIAGNOSIS — Z472 Encounter for removal of internal fixation device: Secondary | ICD-10-CM | POA: Diagnosis not present

## 2017-03-13 DIAGNOSIS — M2012 Hallux valgus (acquired), left foot: Secondary | ICD-10-CM

## 2017-03-13 DIAGNOSIS — M79672 Pain in left foot: Secondary | ICD-10-CM

## 2017-03-13 NOTE — Progress Notes (Signed)
Dg  

## 2017-03-13 NOTE — Progress Notes (Signed)
Subjective:   Patient ID: Kelli Moore, female   DOB: 57 y.o.   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  Removal as there is a pin already in place that satisfactory.  Patient wants procedure and I allowed her to read consent form and signed consent form after review and is scheduled for outpatient surgery understanding risk.  Patient will call with any questions prior to procedure which will be performed in the office  X-ray indicates prominent pin position of the first metatarsal left

## 2017-03-14 ENCOUNTER — Ambulatory Visit
Admission: RE | Admit: 2017-03-14 | Discharge: 2017-03-14 | Disposition: A | Discharge status: Home / Self Care | Payer: BLUE CROSS/BLUE SHIELD | Source: Ambulatory Visit | Attending: General Surgery | Admitting: General Surgery

## 2017-03-14 DIAGNOSIS — N6310 Unspecified lump in the right breast, unspecified quadrant: Secondary | ICD-10-CM

## 2017-03-15 ENCOUNTER — Encounter (HOSPITAL_BASED_OUTPATIENT_CLINIC_OR_DEPARTMENT_OTHER)
Admission: RE | Disposition: A | Discharge status: Home / Self Care | Payer: Self-pay | Source: Ambulatory Visit | Attending: General Surgery

## 2017-03-15 ENCOUNTER — Ambulatory Visit
Admission: RE | Admit: 2017-03-15 | Discharge: 2017-03-15 | Disposition: A | Discharge status: Home / Self Care | Payer: BLUE CROSS/BLUE SHIELD | Source: Ambulatory Visit | Attending: General Surgery | Admitting: General Surgery

## 2017-03-15 ENCOUNTER — Other Ambulatory Visit: Payer: Self-pay

## 2017-03-15 ENCOUNTER — Ambulatory Visit (HOSPITAL_BASED_OUTPATIENT_CLINIC_OR_DEPARTMENT_OTHER)
Admission: RE | Admit: 2017-03-15 | Discharge: 2017-03-15 | Disposition: A | Discharge status: Home / Self Care | Payer: BLUE CROSS/BLUE SHIELD | Source: Ambulatory Visit | Attending: General Surgery | Admitting: General Surgery

## 2017-03-15 ENCOUNTER — Encounter (HOSPITAL_BASED_OUTPATIENT_CLINIC_OR_DEPARTMENT_OTHER): Payer: Self-pay

## 2017-03-15 ENCOUNTER — Ambulatory Visit (HOSPITAL_BASED_OUTPATIENT_CLINIC_OR_DEPARTMENT_OTHER): Payer: BLUE CROSS/BLUE SHIELD | Admitting: Anesthesiology

## 2017-03-15 DIAGNOSIS — N6310 Unspecified lump in the right breast, unspecified quadrant: Secondary | ICD-10-CM

## 2017-03-15 DIAGNOSIS — N6081 Other benign mammary dysplasias of right breast: Secondary | ICD-10-CM | POA: Diagnosis not present

## 2017-03-15 DIAGNOSIS — Z8041 Family history of malignant neoplasm of ovary: Secondary | ICD-10-CM | POA: Insufficient documentation

## 2017-03-15 DIAGNOSIS — N6021 Fibroadenosis of right breast: Secondary | ICD-10-CM | POA: Diagnosis not present

## 2017-03-15 DIAGNOSIS — Z79890 Hormone replacement therapy (postmenopausal): Secondary | ICD-10-CM | POA: Diagnosis not present

## 2017-03-15 DIAGNOSIS — Z79899 Other long term (current) drug therapy: Secondary | ICD-10-CM | POA: Diagnosis not present

## 2017-03-15 HISTORY — PX: BREAST EXCISIONAL BIOPSY: SUR124

## 2017-03-15 HISTORY — DX: Unspecified lump in the right breast, unspecified quadrant: N63.10

## 2017-03-15 HISTORY — DX: Gastro-esophageal reflux disease without esophagitis: K21.9

## 2017-03-15 HISTORY — PX: BREAST LUMPECTOMY WITH RADIOACTIVE SEED LOCALIZATION: SHX6424

## 2017-03-15 SURGERY — BREAST LUMPECTOMY WITH RADIOACTIVE SEED LOCALIZATION
Anesthesia: General | Site: Breast | Laterality: Right

## 2017-03-15 MED ORDER — MIDAZOLAM HCL 2 MG/2ML IJ SOLN
INTRAMUSCULAR | Status: AC
  Filled 2017-03-15: qty 2

## 2017-03-15 MED ORDER — SCOPOLAMINE 1 MG/3DAYS TD PT72: 1.0000 | MEDICATED_PATCH | Freq: Once | TRANSDERMAL | Status: DC | PRN

## 2017-03-15 MED ORDER — EPHEDRINE 5 MG/ML INJ
INTRAVENOUS | Status: AC
  Filled 2017-03-15: qty 10

## 2017-03-15 MED ORDER — LIDOCAINE 2% (20 MG/ML) 5 ML SYRINGE
INTRAMUSCULAR | Status: AC
  Filled 2017-03-15: qty 5

## 2017-03-15 MED ORDER — SUCCINYLCHOLINE CHLORIDE 200 MG/10ML IV SOSY
PREFILLED_SYRINGE | INTRAVENOUS | Status: AC
  Filled 2017-03-15: qty 10

## 2017-03-15 MED ORDER — OXYCODONE HCL 5 MG PO TABS: 5.0000 mg | ORAL_TABLET | Freq: Once | ORAL | Status: DC | PRN

## 2017-03-15 MED ORDER — ACETAMINOPHEN 500 MG PO TABS
ORAL_TABLET | ORAL | Status: AC
  Filled 2017-03-15: qty 2

## 2017-03-15 MED ORDER — CEFAZOLIN SODIUM-DEXTROSE 2-4 GM/100ML-% IV SOLN
2.0000 g | INTRAVENOUS | Status: AC
  Administered 2017-03-15: 2 g via INTRAVENOUS

## 2017-03-15 MED ORDER — DEXAMETHASONE SODIUM PHOSPHATE 4 MG/ML IJ SOLN
INTRAMUSCULAR | Status: DC | PRN
  Administered 2017-03-15: 10 mg via INTRAVENOUS

## 2017-03-15 MED ORDER — OXYCODONE HCL 5 MG/5ML PO SOLN: 5.0000 mg | Freq: Once | ORAL | Status: DC | PRN

## 2017-03-15 MED ORDER — LIDOCAINE HCL (PF) 1 % IJ SOLN
INTRAMUSCULAR | Status: AC
  Filled 2017-03-15: qty 30

## 2017-03-15 MED ORDER — ACETAMINOPHEN 500 MG PO TABS
1000.0000 mg | ORAL_TABLET | ORAL | Status: AC
  Administered 2017-03-15: 1000 mg via ORAL

## 2017-03-15 MED ORDER — BUPIVACAINE HCL (PF) 0.5 % IJ SOLN
INTRAMUSCULAR | Status: AC
  Filled 2017-03-15: qty 30

## 2017-03-15 MED ORDER — FENTANYL CITRATE (PF) 100 MCG/2ML IJ SOLN
INTRAMUSCULAR | Status: AC
  Filled 2017-03-15: qty 2

## 2017-03-15 MED ORDER — FENTANYL CITRATE (PF) 100 MCG/2ML IJ SOLN: 25.0000 ug | INTRAMUSCULAR | Status: DC | PRN

## 2017-03-15 MED ORDER — BUPIVACAINE-EPINEPHRINE 0.5% -1:200000 IJ SOLN
INTRAMUSCULAR | Status: DC | PRN
  Administered 2017-03-15: 18 mL

## 2017-03-15 MED ORDER — ONDANSETRON HCL 4 MG/2ML IJ SOLN
INTRAMUSCULAR | Status: DC | PRN
  Administered 2017-03-15: 4 mg via INTRAVENOUS

## 2017-03-15 MED ORDER — CEFAZOLIN SODIUM-DEXTROSE 2-4 GM/100ML-% IV SOLN
INTRAVENOUS | Status: AC
  Filled 2017-03-15: qty 100

## 2017-03-15 MED ORDER — HYDROCODONE-ACETAMINOPHEN 5-325 MG PO TABS: 1.0000 | ORAL_TABLET | Freq: Four times a day (QID) | ORAL | 0 refills | Status: DC | PRN

## 2017-03-15 MED ORDER — LACTATED RINGERS IV SOLN
INTRAVENOUS | Status: DC
  Administered 2017-03-15 (×2): via INTRAVENOUS

## 2017-03-15 MED ORDER — BUPIVACAINE HCL (PF) 0.25 % IJ SOLN
INTRAMUSCULAR | Status: AC
  Filled 2017-03-15: qty 90

## 2017-03-15 MED ORDER — ONDANSETRON HCL 4 MG/2ML IJ SOLN
INTRAMUSCULAR | Status: AC
  Filled 2017-03-15: qty 2

## 2017-03-15 MED ORDER — CHLORHEXIDINE GLUCONATE CLOTH 2 % EX PADS: 6.0000 | MEDICATED_PAD | Freq: Once | CUTANEOUS | Status: DC

## 2017-03-15 MED ORDER — EPHEDRINE SULFATE 50 MG/ML IJ SOLN
INTRAMUSCULAR | Status: DC | PRN
  Administered 2017-03-15: 10 mg via INTRAVENOUS

## 2017-03-15 MED ORDER — FENTANYL CITRATE (PF) 100 MCG/2ML IJ SOLN
50.0000 ug | INTRAMUSCULAR | Status: DC | PRN
  Administered 2017-03-15: 100 ug via INTRAVENOUS

## 2017-03-15 MED ORDER — LIDOCAINE HCL (CARDIAC) 20 MG/ML IV SOLN
INTRAVENOUS | Status: DC | PRN
  Administered 2017-03-15: 50 mg via INTRAVENOUS

## 2017-03-15 MED ORDER — CELECOXIB 200 MG PO CAPS
ORAL_CAPSULE | ORAL | Status: AC
  Filled 2017-03-15: qty 1

## 2017-03-15 MED ORDER — GABAPENTIN 300 MG PO CAPS
ORAL_CAPSULE | ORAL | Status: AC
  Filled 2017-03-15: qty 1

## 2017-03-15 MED ORDER — MIDAZOLAM HCL 2 MG/2ML IJ SOLN
1.0000 mg | INTRAMUSCULAR | Status: DC | PRN
  Administered 2017-03-15: 2 mg via INTRAVENOUS

## 2017-03-15 MED ORDER — PHENYLEPHRINE 40 MCG/ML (10ML) SYRINGE FOR IV PUSH (FOR BLOOD PRESSURE SUPPORT)
PREFILLED_SYRINGE | INTRAVENOUS | Status: AC
  Filled 2017-03-15: qty 10

## 2017-03-15 MED ORDER — GABAPENTIN 300 MG PO CAPS
300.0000 mg | ORAL_CAPSULE | ORAL | Status: AC
  Administered 2017-03-15: 300 mg via ORAL

## 2017-03-15 MED ORDER — ONDANSETRON HCL 4 MG/2ML IJ SOLN: 4.0000 mg | Freq: Four times a day (QID) | INTRAMUSCULAR | Status: DC | PRN

## 2017-03-15 MED ORDER — CELECOXIB 200 MG PO CAPS
200.0000 mg | ORAL_CAPSULE | ORAL | Status: AC
  Administered 2017-03-15: 200 mg via ORAL

## 2017-03-15 MED ORDER — PROPOFOL 10 MG/ML IV BOLUS
INTRAVENOUS | Status: DC | PRN
  Administered 2017-03-15: 150 mg via INTRAVENOUS

## 2017-03-15 MED ORDER — DEXAMETHASONE SODIUM PHOSPHATE 10 MG/ML IJ SOLN
INTRAMUSCULAR | Status: AC
  Filled 2017-03-15: qty 1

## 2017-03-15 SURGICAL SUPPLY — 44 items
APPLIER CLIP 9.375 MED OPEN (MISCELLANEOUS) ×2
BLADE SURG 15 STRL LF DISP TIS (BLADE) ×1 IMPLANT
BLADE SURG 15 STRL SS (BLADE) ×1
CANISTER SUC SOCK COL 7IN (MISCELLANEOUS) ×2 IMPLANT
CANISTER SUCT 1200ML W/VALVE (MISCELLANEOUS) ×2 IMPLANT
CHLORAPREP W/TINT 26ML (MISCELLANEOUS) ×2 IMPLANT
CLIP APPLIE 9.375 MED OPEN (MISCELLANEOUS) ×1 IMPLANT
COVER BACK TABLE 60X90IN (DRAPES) ×2 IMPLANT
COVER MAYO STAND STRL (DRAPES) ×2 IMPLANT
COVER PROBE W GEL 5X96 (DRAPES) ×2 IMPLANT
DECANTER SPIKE VIAL GLASS SM (MISCELLANEOUS) IMPLANT
DERMABOND ADVANCED (GAUZE/BANDAGES/DRESSINGS) ×1
DERMABOND ADVANCED .7 DNX12 (GAUZE/BANDAGES/DRESSINGS) ×1 IMPLANT
DEVICE DUBIN W/COMP PLATE 8390 (MISCELLANEOUS) ×2 IMPLANT
DRAPE LAPAROSCOPIC ABDOMINAL (DRAPES) ×2 IMPLANT
DRAPE UTILITY XL STRL (DRAPES) ×2 IMPLANT
ELECT COATED BLADE 2.86 ST (ELECTRODE) ×2 IMPLANT
ELECT REM PT RETURN 9FT ADLT (ELECTROSURGICAL) ×2
ELECTRODE REM PT RTRN 9FT ADLT (ELECTROSURGICAL) ×1 IMPLANT
GLOVE BIO SURGEON STRL SZ 6.5 (GLOVE) ×4 IMPLANT
GLOVE BIO SURGEON STRL SZ7.5 (GLOVE) ×4 IMPLANT
GLOVE BIOGEL PI IND STRL 7.0 (GLOVE) ×1 IMPLANT
GLOVE BIOGEL PI IND STRL 8 (GLOVE) ×1 IMPLANT
GLOVE BIOGEL PI INDICATOR 7.0 (GLOVE) ×1
GLOVE BIOGEL PI INDICATOR 8 (GLOVE) ×1
GOWN STRL REUS W/ TWL LRG LVL3 (GOWN DISPOSABLE) ×3 IMPLANT
GOWN STRL REUS W/TWL LRG LVL3 (GOWN DISPOSABLE) ×3
ILLUMINATOR WAVEGUIDE N/F (MISCELLANEOUS) IMPLANT
KIT MARKER MARGIN INK (KITS) ×2 IMPLANT
LIGHT WAVEGUIDE WIDE FLAT (MISCELLANEOUS) IMPLANT
NEEDLE HYPO 25X1 1.5 SAFETY (NEEDLE) ×2 IMPLANT
NS IRRIG 1000ML POUR BTL (IV SOLUTION) IMPLANT
PACK BASIN DAY SURGERY FS (CUSTOM PROCEDURE TRAY) ×2 IMPLANT
PENCIL BUTTON HOLSTER BLD 10FT (ELECTRODE) ×2 IMPLANT
SLEEVE SCD COMPRESS KNEE MED (MISCELLANEOUS) ×2 IMPLANT
SPONGE LAP 18X18 X RAY DECT (DISPOSABLE) ×2 IMPLANT
SUT MON AB 4-0 PC3 18 (SUTURE) IMPLANT
SUT SILK 2 0 SH (SUTURE) IMPLANT
SUT VICRYL 3-0 CR8 SH (SUTURE) ×2 IMPLANT
SYR CONTROL 10ML LL (SYRINGE) ×2 IMPLANT
TOWEL OR 17X24 6PK STRL BLUE (TOWEL DISPOSABLE) ×2 IMPLANT
TOWEL OR NON WOVEN STRL DISP B (DISPOSABLE) ×2 IMPLANT
TUBE CONNECTING 20X1/4 (TUBING) ×2 IMPLANT
YANKAUER SUCT BULB TIP NO VENT (SUCTIONS) IMPLANT

## 2017-03-15 NOTE — Transfer of Care (Signed)
Immediate Anesthesia Transfer of Care Note  Patient: Kelli Moore  Procedure(s) Performed: RIGHT BREAST LUMPECTOMY WITH RADIOACTIVE SEED LOCALIZATION ERAS PATHWAY (Right Breast)  Patient Location: PACU  Anesthesia Type:General  Level of Consciousness: awake, alert , oriented and drowsy  Airway & Oxygen Therapy: Patient Spontanous Breathing and Patient connected to face mask oxygen  Post-op Assessment: Report given to RN and Post -op Vital signs reviewed and stable  Post vital signs: Reviewed and stable  Last Vitals:  Vitals:   03/15/17 0647  BP: (!) 110/51  Pulse: 69  Resp: 18  Temp: 36.7 C  SpO2: 100%    Last Pain:  Vitals:   03/15/17 0647  TempSrc: Oral         Complications: No apparent anesthesia complications

## 2017-03-15 NOTE — Anesthesia Preprocedure Evaluation (Signed)
Anesthesia Evaluation  Patient identified by MRN, date of birth, ID band Patient awake    Reviewed: Allergy & Precautions, H&P , NPO status , Patient's Chart, lab work & pertinent test results  Airway Mallampati: II   Neck ROM: full    Dental   Pulmonary neg pulmonary ROS,    breath sounds clear to auscultation       Cardiovascular negative cardio ROS   Rhythm:regular Rate:Normal     Neuro/Psych PSYCHIATRIC DISORDERS Anxiety Depression    GI/Hepatic GERD  ,  Endo/Other    Renal/GU      Musculoskeletal   Abdominal   Peds  Hematology   Anesthesia Other Findings   Reproductive/Obstetrics                             Anesthesia Physical Anesthesia Plan  ASA: II  Anesthesia Plan: General   Post-op Pain Management:    Induction: Intravenous  PONV Risk Score and Plan: 3 and Ondansetron, Dexamethasone, Midazolam and Treatment may vary due to age or medical condition  Airway Management Planned: LMA  Additional Equipment:   Intra-op Plan:   Post-operative Plan:   Informed Consent: I have reviewed the patients History and Physical, chart, labs and discussed the procedure including the risks, benefits and alternatives for the proposed anesthesia with the patient or authorized representative who has indicated his/her understanding and acceptance.     Plan Discussed with: CRNA, Anesthesiologist and Surgeon  Anesthesia Plan Comments:         Anesthesia Quick Evaluation

## 2017-03-15 NOTE — Anesthesia Procedure Notes (Signed)
Procedure Name: LMA Insertion Date/Time: 03/15/2017 7:40 AM Performed by: Willa Frater, CRNA Pre-anesthesia Checklist: Patient identified, Emergency Drugs available, Suction available and Patient being monitored Patient Re-evaluated:Patient Re-evaluated prior to induction Oxygen Delivery Method: Circle system utilized Preoxygenation: Pre-oxygenation with 100% oxygen Induction Type: IV induction Ventilation: Mask ventilation without difficulty LMA: LMA inserted LMA Size: 4.0 Number of attempts: 1 Airway Equipment and Method: Bite block Placement Confirmation: positive ETCO2 Tube secured with: Tape Dental Injury: Teeth and Oropharynx as per pre-operative assessment

## 2017-03-15 NOTE — Op Note (Signed)
03/15/2017  8:30 AM  PATIENT:  Kelli Moore  57 y.o. female  PRE-OPERATIVE DIAGNOSIS:  right breast mass  POST-OPERATIVE DIAGNOSIS:  right breast mass  PROCEDURE:  Procedure(s): RIGHT BREAST LUMPECTOMY WITH RADIOACTIVE SEED LOCALIZATION ERAS PATHWAY (Right)  SURGEON:  Surgeon(s) and Role:    Jovita Kussmaul, MD - Primary  PHYSICIAN ASSISTANT:   ASSISTANTS: none   ANESTHESIA:   local and general  EBL:  5 mL   BLOOD ADMINISTERED:none  DRAINS: none   LOCAL MEDICATIONS USED:  MARCAINE     SPECIMEN:  Source of Specimen:  right breast tissue  DISPOSITION OF SPECIMEN:  PATHOLOGY  COUNTS:  YES  TOURNIQUET:  * No tourniquets in log *  DICTATION: .Dragon Dictation   After informed consent was obtained the patient was brought to the operating room and placed in the supine position on the operating table.  After adequate induction of general anesthesia the patient's right breast was prepped with ChloraPrep, allowed to dry, and draped in usual sterile manner.  An appropriate timeout was performed.  Previously an I-125 seed was placed in the outer aspect of the right breast to mark an area of discordance.  The neoprobe was set to I-125 in the area of radioactivity was readily identified laterally.  The area around this was infiltrated with quarter percent Marcaine.  A curvilinear incision was made with a 15 blade knife along the outer edge of the areola of the right breast.  The incision was carried through the skin and subcutaneous tissue sharply with electrocautery.  The dissection was then carried towards the radioactive seed under the direction of the neoprobe.  Once I approached the radioactive seed more closely I then removed a circular portion of breast tissue sharply with the electrocautery around the radioactive seed while checking the area of radioactivity frequently.  Once the specimen was removed it was oriented with the appropriate paint colors.  A specimen radiograph was  obtained that showed the clip and seed to be within the specimen.  The specimen was then sent to pathology for further evaluation.  Hemostasis was achieved using the Bovie electrocautery.  The wound was irrigated with saline and infiltrated with more quarter percent Marcaine.  The deep layer of the wound was then closed with layers of interrupted 3-0 Vicryl stitches.  The skin was then closed with interrupted 4-0 Monocryl subcuticular stitches.  Dermabond dressings were applied.  The patient tolerated the procedure well.  At the end of the case all needle sponge and instrument counts were correct.  The patient was then awakened and taken to recovery in stable condition.  PLAN OF CARE: Discharge to home after PACU  PATIENT DISPOSITION:  PACU - hemodynamically stable.   Delay start of Pharmacological VTE agent (>24hrs) due to surgical blood loss or risk of bleeding: not applicable

## 2017-03-15 NOTE — Anesthesia Postprocedure Evaluation (Signed)
Anesthesia Post Note  Patient: Kelli Moore  Procedure(s) Performed: RIGHT BREAST LUMPECTOMY WITH RADIOACTIVE SEED LOCALIZATION ERAS PATHWAY (Right Breast)     Patient location during evaluation: PACU Anesthesia Type: General Level of consciousness: awake and alert Pain management: pain level controlled Vital Signs Assessment: post-procedure vital signs reviewed and stable Respiratory status: spontaneous breathing, nonlabored ventilation, respiratory function stable and patient connected to nasal cannula oxygen Cardiovascular status: blood pressure returned to baseline and stable Postop Assessment: no apparent nausea or vomiting Anesthetic complications: no    Last Vitals:  Vitals:   03/15/17 0900 03/15/17 0926  BP: (!) 119/57 (!) 125/57  Pulse: 91 90  Resp: 11 14  Temp:  36.6 C  SpO2: 97% 98%    Last Pain:  Vitals:   03/15/17 0926  TempSrc:   PainSc: 0-No pain                 Zabrina Brotherton S

## 2017-03-15 NOTE — Anesthesia Procedure Notes (Signed)
Performed by: Terilynn Buresh D, CRNA       

## 2017-03-15 NOTE — H&P (Signed)
Kelli Moore  Location: [facility] Patient #: [id number] DOB: 12/28/1960 Married / Language: English / Race: White Female   History of Present Illness The patient is a 57 year old female who presents with a breast mass. We are asked to see the patient in consultation by Dr. Peggye Fothergill to evaluate her for a right breast mass. The patient is a 57 year old white female who recently went for a routine screening mammogram. At that time she was found to have a small area of distortion in the lateral right breast. This was biopsied and came back as fibrocystic disease with calcification. The radiologist felt that this was discordant. For this reason the recommendation was to have this area removed. She denies any family history of breast cancer although she has had a variety of other colon and ovarian cancers on her mother's side. She does not smoke.   Past Surgical History  Breast Biopsy  Right. Colon Polyp Removal - Colonoscopy  Foot Surgery  Bilateral.  Diagnostic Studies History  Colonoscopy  within last year Mammogram  1-3 years ago Pap Smear  1-5 years ago  Allergies  No Known Allergies  Allergies Reconciled   Medication History  FLUoxetine HCl (10MG  Capsule, 1 Oral daily) Active. Estradiol (10MCG Tablet, Vaginal) Active. Multi Vitamin (Oral) Specific strength unknown - Active. Probiotic (Oral) Specific strength unknown - Active. Medications Reconciled  Social History Alcohol use  Occasional alcohol use. Caffeine use  Coffee, Tea. No drug use  Tobacco use  Never smoker.  Family History  Cancer  Family Members In General. Colon Cancer  Family Members In General, Mother. Heart Disease  Father. Hypertension  Father. Ovarian Cancer  Family Members In General. Rectal Cancer  Mother.  Pregnancy / Birth History  Age at menarche  89 years. Age of menopause  [age] Contraceptive History  Oral contraceptives. Gravida   0 Irregular periods  Para  0  Other Problems  Back Pain     Review of Systems  General Not Present- Appetite Loss, Chills, Fatigue, Fever, Night Sweats, Weight Gain and Weight Loss. Skin Not Present- Change in Wart/Mole, Dryness, Hives, Jaundice, New Lesions, Non-Healing Wounds, Rash and Ulcer. HEENT Present- Wears glasses/contact lenses. Not Present- Earache, Hearing Loss, Hoarseness, Nose Bleed, Oral Ulcers, Ringing in the Ears, Seasonal Allergies, Sinus Pain, Sore Throat, Visual Disturbances and Yellow Eyes. Respiratory Present- Snoring. Not Present- Bloody sputum, Chronic Cough, Difficulty Breathing and Wheezing. Breast Not Present- Breast Mass, Breast Pain, Nipple Discharge and Skin Changes. Cardiovascular Not Present- Chest Pain, Difficulty Breathing Lying Down, Leg Cramps, Palpitations, Rapid Heart Rate, Shortness of Breath and Swelling of Extremities. Gastrointestinal Not Present- Abdominal Pain, Bloating, Bloody Stool, Change in Bowel Habits, Chronic diarrhea, Constipation, Difficulty Swallowing, Excessive gas, Gets full quickly at meals, Hemorrhoids, Indigestion, Nausea, Rectal Pain and Vomiting. Female Genitourinary Present- Frequency. Not Present- Nocturia, Painful Urination, Pelvic Pain and Urgency. Musculoskeletal Present- Back Pain and Joint Pain. Not Present- Joint Stiffness, Muscle Pain, Muscle Weakness and Swelling of Extremities. Neurological Not Present- Decreased Memory, Fainting, Headaches, Numbness, Seizures, Tingling, Tremor, Trouble walking and Weakness. Psychiatric Not Present- Anxiety, Bipolar, Change in Sleep Pattern, Depression, Fearful and Frequent crying. Endocrine Not Present- Cold Intolerance, Excessive Hunger, Hair Changes, Heat Intolerance, Hot flashes and New Diabetes. Hematology Not Present- Blood Thinners, Easy Bruising, Excessive bleeding, Gland problems, HIV and Persistent Infections.  Vitals  Weight: 165.5 lb Height: 64in Body Surface Area:  1.8 m Body Mass Index: 28.41 kg/m  Temp.: 98.44F(Oral)  Pulse: 96 (Regular)  BP: 122/84 (Sitting, Right Arm, Standard)       Physical Exam  General Mental Status-Alert. General Appearance-Consistent with stated age. Hydration-Well hydrated. Voice-Normal.  Head and Neck Head-normocephalic, atraumatic with no lesions or palpable masses. Trachea-midline. Thyroid Gland Characteristics - normal size and consistency.  Eye Eyeball - Bilateral-Extraocular movements intact. Sclera/Conjunctiva - Bilateral-No scleral icterus.  Chest and Lung Exam Chest and lung exam reveals -quiet, even and easy respiratory effort with no use of accessory muscles and on auscultation, normal breath sounds, no adventitious sounds and normal vocal resonance. Inspection Chest Wall - Normal. Back - normal.  Breast Note: There is no palpable mass in either breast. There is no palpable axillary, supraclavicular, or cervical lymphadenopathy.   Cardiovascular Cardiovascular examination reveals -normal heart sounds, regular rate and rhythm with no murmurs and normal pedal pulses bilaterally.  Abdomen Inspection Inspection of the abdomen reveals - No Hernias. Skin - Scar - no surgical scars. Palpation/Percussion Palpation and Percussion of the abdomen reveal - Soft, Non Tender, No Rebound tenderness, No Rigidity (guarding) and No hepatosplenomegaly. Auscultation Auscultation of the abdomen reveals - Bowel sounds normal.  Neurologic Neurologic evaluation reveals -alert and oriented x 3 with no impairment of recent or remote memory. Mental Status-Normal.  Musculoskeletal Normal Exam - Left-Upper Extremity Strength Normal and Lower Extremity Strength Normal. Normal Exam - Right-Upper Extremity Strength Normal and Lower Extremity Strength Normal.  Lymphatic Head & Neck  General Head & Neck Lymphatics: Bilateral - Description - Normal. Axillary  General  Axillary Region: Bilateral - Description - Normal. Tenderness - Non Tender. Femoral & Inguinal  Generalized Femoral & Inguinal Lymphatics: Bilateral - Description - Normal. Tenderness - Non Tender.    Assessment & Plan  BREAST MASS, RIGHT (N63.10) Impression: The patient appears to have a small area of distortion in the lateral right breast. The biopsy showed benign fibrocystic tissue but the radiologist felt that this was discordant. For this reason the recommendation is to have this area removed with a lumpectomy. I have discussed this operation with her in detail including the risks and benefits as well as some of the technical aspects and she understands and wishes to proceed. I will plan for a right breast radioactive seed localized lumpectomy Current Plans Pt Education - Fibrocystic Changes of the Breast: discussed with patient and provided information.

## 2017-03-15 NOTE — Interval H&P Note (Signed)
History and Physical Interval Note:  03/15/2017 7:23 AM  Kelli Moore  has presented today for surgery, with the diagnosis of right breast mass  The various methods of treatment have been discussed with the patient and family. After consideration of risks, benefits and other options for treatment, the patient has consented to  Procedure(s): RIGHT BREAST LUMPECTOMY WITH RADIOACTIVE SEED LOCALIZATION ERAS PATHWAY (Right) as a surgical intervention .  The patient's history has been reviewed, patient examined, no change in status, stable for surgery.  I have reviewed the patient's chart and labs.  Questions were answered to the patient's satisfaction.     TOTH III,Zurii Hewes S

## 2017-03-15 NOTE — Discharge Instructions (Signed)

## 2017-03-18 ENCOUNTER — Telehealth: Payer: Self-pay | Admitting: *Deleted

## 2017-03-18 ENCOUNTER — Encounter (HOSPITAL_BASED_OUTPATIENT_CLINIC_OR_DEPARTMENT_OTHER): Payer: Self-pay | Admitting: General Surgery

## 2017-03-18 NOTE — Telephone Encounter (Signed)
I am calling you in regards to your surgery that's scheduled for Wednesday.  We need to change your time.  Can we change it to an arrival time of 1 pm?  "He thought he only does surgeries during the morning.  I mean I'm okay with the time.  I just want to make sure he's okay with the time change."  He's the one that made the request for the time change.  "Okay I will be there at 1 pm on Wednesday."

## 2017-03-18 NOTE — Addendum Note (Signed)
Addendum  created 03/18/17 3748 by Tameko Halder, Ernesta Amble, CRNA   Charge Capture section accepted

## 2017-03-20 ENCOUNTER — Encounter: Payer: Self-pay | Admitting: Podiatry

## 2017-03-20 ENCOUNTER — Ambulatory Visit (INDEPENDENT_AMBULATORY_CARE_PROVIDER_SITE_OTHER): Payer: BLUE CROSS/BLUE SHIELD | Admitting: Podiatry

## 2017-03-20 ENCOUNTER — Ambulatory Visit: Payer: BLUE CROSS/BLUE SHIELD | Admitting: Podiatry

## 2017-03-20 VITALS — BP 120/71 | HR 75 | Temp 97.1°F | Resp 18

## 2017-03-20 DIAGNOSIS — Z472 Encounter for removal of internal fixation device: Secondary | ICD-10-CM | POA: Diagnosis not present

## 2017-03-20 NOTE — Progress Notes (Signed)
Subjective:   Patient ID: Kelli Moore, female   DOB: 57 y.o.   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  I noted there to be a prominent pin and utilizing sharp and blunt dissection I removed the pin in toto wash the wound and sutured with 5-0 nylon and applied sterile dressing.  The tourniquet was released and capillary fill is noted to be needed all digits and patient left the OR in satisfactory condition

## 2017-04-03 ENCOUNTER — Encounter: Payer: Self-pay | Admitting: Podiatry

## 2017-04-03 ENCOUNTER — Ambulatory Visit (INDEPENDENT_AMBULATORY_CARE_PROVIDER_SITE_OTHER): Payer: Self-pay | Admitting: Podiatry

## 2017-04-03 DIAGNOSIS — M2010 Hallux valgus (acquired), unspecified foot: Secondary | ICD-10-CM

## 2017-04-03 DIAGNOSIS — Z472 Encounter for removal of internal fixation device: Secondary | ICD-10-CM

## 2017-04-03 NOTE — Progress Notes (Signed)
Subjective:   Patient ID: Kelli Moore, female   DOB: 57 y.o.   MRN: [id number]   HPI Patient states doing really well with minimal discomfort   ROS      Objective:  Physical Exam  Neurovascular status intact with patient's left foot doing well with wound edges well coapted stitches in place     Assessment:  Doing well post pin removal left     Plan:  Stitches removed and sterile dressing applied and discussed gradual return to normal shoe gear  X-rays indicate satisfactory removal of pin

## 2017-04-11 ENCOUNTER — Other Ambulatory Visit: Payer: BLUE CROSS/BLUE SHIELD | Admitting: Podiatry

## 2017-04-17 ENCOUNTER — Ambulatory Visit: Payer: BLUE CROSS/BLUE SHIELD | Admitting: Podiatry

## 2017-06-27 ENCOUNTER — Other Ambulatory Visit: Payer: Self-pay | Admitting: Podiatry

## 2017-06-27 ENCOUNTER — Ambulatory Visit (INDEPENDENT_AMBULATORY_CARE_PROVIDER_SITE_OTHER): Payer: BLUE CROSS/BLUE SHIELD | Admitting: Podiatry

## 2017-06-27 ENCOUNTER — Encounter: Payer: Self-pay | Admitting: Podiatry

## 2017-06-27 ENCOUNTER — Ambulatory Visit (INDEPENDENT_AMBULATORY_CARE_PROVIDER_SITE_OTHER): Payer: BLUE CROSS/BLUE SHIELD

## 2017-06-27 DIAGNOSIS — M21622 Bunionette of left foot: Secondary | ICD-10-CM

## 2017-06-27 DIAGNOSIS — M79672 Pain in left foot: Secondary | ICD-10-CM

## 2017-06-27 DIAGNOSIS — M779 Enthesopathy, unspecified: Secondary | ICD-10-CM

## 2017-06-27 MED ORDER — TRIAMCINOLONE ACETONIDE 10 MG/ML IJ SUSP
10.0000 mg | Freq: Once | INTRAMUSCULAR | Status: AC
  Administered 2017-06-27: 10 mg

## 2017-06-27 NOTE — Patient Instructions (Signed)

## 2017-06-30 NOTE — Progress Notes (Signed)
Subjective:   Patient ID: Kelli Moore, female   DOB: 57 y.o.   MRN: [id number]   HPI Patient states she is developed a lot of pain around the left fifth metatarsal and does not remember specific injury and is doing very well overall with surgery   ROS      Objective:  Physical Exam  Neurovascular status intact with patient noted to have good healing surgical site left first metatarsal with inflammation redness and fluid around the fifth MPJ left     Assessment:  Inflammatory capsulitis fifth MPJ left with possibility for tailor's bunion deformity     Plan:  H&P x-ray reviewed and discussed tailor's bunion deformity.  At this time sterile prep applied and injected the fifth MPJ left 3 mg Kenalog 5 mg Xylocaine advised on wider shoes and reappoint to recheck  X-ray indicates that the patient has mild inflammation fluid with slight increase in the metatarsal angle with excellent alignment of the first metatarsal

## 2017-09-02 ENCOUNTER — Telehealth: Payer: Self-pay | Admitting: *Deleted

## 2017-09-02 NOTE — Telephone Encounter (Signed)
Patient called left message in triage voicemail asking if Dr.Lavoie does Josph Macho procedure. I called patient back and received voicemail, I left on voicemail she does not.

## 2017-10-10 ENCOUNTER — Ambulatory Visit (INDEPENDENT_AMBULATORY_CARE_PROVIDER_SITE_OTHER): Payer: BLUE CROSS/BLUE SHIELD

## 2017-10-10 ENCOUNTER — Encounter: Payer: Self-pay | Admitting: Podiatry

## 2017-10-10 ENCOUNTER — Ambulatory Visit (INDEPENDENT_AMBULATORY_CARE_PROVIDER_SITE_OTHER): Payer: BLUE CROSS/BLUE SHIELD | Admitting: Podiatry

## 2017-10-10 DIAGNOSIS — M722 Plantar fascial fibromatosis: Secondary | ICD-10-CM

## 2017-10-10 DIAGNOSIS — F329 Major depressive disorder, single episode, unspecified: Secondary | ICD-10-CM | POA: Insufficient documentation

## 2017-10-10 DIAGNOSIS — F32A Depression, unspecified: Secondary | ICD-10-CM | POA: Insufficient documentation

## 2017-10-10 MED ORDER — TRIAMCINOLONE ACETONIDE 10 MG/ML IJ SUSP
10.0000 mg | Freq: Once | INTRAMUSCULAR | Status: DC
Start: 2017-10-10 — End: 2021-05-09

## 2017-10-10 MED ORDER — MELOXICAM 7.5 MG PO TABS: 7.5000 mg | ORAL_TABLET | Freq: Every day | ORAL | 0 refills | Status: DC

## 2017-10-10 NOTE — Patient Instructions (Signed)

## 2017-10-14 DIAGNOSIS — M722 Plantar fascial fibromatosis: Secondary | ICD-10-CM | POA: Insufficient documentation

## 2017-10-14 NOTE — Progress Notes (Signed)
Subjective: 57 year old female presents the office today for concerns of heel pain on the right side which is been ongoing about 3 weeks.  She denies any recent injury or trauma to her feet.  Pain is intermittent in nature.  She denies any swelling or redness and no numbness or tingling.  Pain does not wake up at night Denies any systemic complaints such as fevers, chills, nausea, vomiting. No acute changes since last appointment, and no other complaints at this time.   Objective: AAO x3, NAD DP/PT pulses palpable bilaterally, CRT less than 3 seconds There is tenderness palpation on the plantar medial tubercle of the calcaneus at the insertion of plantar fashion on the right foot.  Plantar fascial Tinel sign.  No other areas of tenderness.  No edema, erythema.  No open lesions or pre-ulcerative lesions.  No pain with calf compression, swelling, warmth, erythema  Assessment: Right pain, plantar fasciitis  Plan: -All treatment options discussed with the patient including all alternatives, risks, complications.  -X-rays were obtained and reviewed there is no evidence of acute fracture or stress fracture identified today -Steroid injection performed.  See procedure note below. -Plantar fascial brace dispensed -Discussed shoe modifications and orthotics -Patient encouraged to call the office with any questions, concerns, change in symptoms.   Procedure: Injection Tendon/Ligament Discussed alternatives, risks, complications and verbal consent was obtained.  Location: Right plantar fascia at the glabrous junction; medial approach. Skin Prep: Alcohol. Injectate: 0.5cc 0.5% marcaine plain, 0.5 cc 2% lidocaine plain and, 1 cc kenalog 10. Disposition: Patient tolerated procedure well. Injection site dressed with a band-aid.  Post-injection care was discussed and return precautions discussed.   Trula Slade DPM

## 2017-10-31 ENCOUNTER — Ambulatory Visit (INDEPENDENT_AMBULATORY_CARE_PROVIDER_SITE_OTHER): Payer: BLUE CROSS/BLUE SHIELD | Admitting: Podiatry

## 2017-10-31 ENCOUNTER — Encounter: Payer: Self-pay | Admitting: Podiatry

## 2017-10-31 DIAGNOSIS — M722 Plantar fascial fibromatosis: Secondary | ICD-10-CM | POA: Diagnosis not present

## 2017-10-31 MED ORDER — TRIAMCINOLONE ACETONIDE 10 MG/ML IJ SUSP
10.0000 mg | Freq: Once | INTRAMUSCULAR | Status: AC
  Administered 2017-10-31: 10 mg

## 2017-10-31 NOTE — Progress Notes (Signed)
Subjective:   Patient ID: Kelli Moore, female   DOB: 57 y.o.   MRN: [id number]   HPI Patient states doing some better but still having pain in the plantar aspect of the right heel especially when she tries to be active   ROS      Objective:  Physical Exam  Neurovascular status intact with pain in the plantar aspect right at the insertional point tendon calcaneus with inflammation fluid around the medial band     Assessment:  Continuation plantar fasciitis right with pain     Plan:  Instructed on physical therapy and wearing shoes with a moderate elevation in the heel and I went ahead today and injected the plantar fascial right 3 mg Kenalog 5 mg Xylocaine instructed on stretching and reappoint if symptoms persist

## 2017-11-05 ENCOUNTER — Other Ambulatory Visit: Payer: Self-pay

## 2017-11-05 ENCOUNTER — Other Ambulatory Visit: Payer: Self-pay | Admitting: Obstetrics & Gynecology

## 2017-11-05 MED ORDER — NONFORMULARY OR COMPOUNDED ITEM: 0 refills | Status: DC

## 2017-11-05 NOTE — Telephone Encounter (Signed)
Annual scheduled on 11/20/17

## 2017-11-14 ENCOUNTER — Other Ambulatory Visit: Payer: Self-pay | Admitting: Obstetrics & Gynecology

## 2017-11-14 DIAGNOSIS — Z1231 Encounter for screening mammogram for malignant neoplasm of breast: Secondary | ICD-10-CM

## 2017-11-20 ENCOUNTER — Ambulatory Visit (INDEPENDENT_AMBULATORY_CARE_PROVIDER_SITE_OTHER): Payer: BLUE CROSS/BLUE SHIELD | Admitting: Obstetrics & Gynecology

## 2017-11-20 ENCOUNTER — Encounter: Payer: Self-pay | Admitting: Obstetrics & Gynecology

## 2017-11-20 VITALS — BP 130/80 | Ht 64.0 in | Wt 161.0 lb

## 2017-11-20 DIAGNOSIS — Z23 Encounter for immunization: Secondary | ICD-10-CM

## 2017-11-20 DIAGNOSIS — Z01419 Encounter for gynecological examination (general) (routine) without abnormal findings: Secondary | ICD-10-CM | POA: Diagnosis not present

## 2017-11-20 DIAGNOSIS — Z780 Asymptomatic menopausal state: Secondary | ICD-10-CM | POA: Diagnosis not present

## 2017-11-20 DIAGNOSIS — E663 Overweight: Secondary | ICD-10-CM

## 2017-11-20 NOTE — Progress Notes (Signed)
Kelli Moore 1960-01-31 [id number]   History:    57 y.o. G0 Married  RP:  Established patient presenting for annual gyn exam   HPI: Menopause, well on no hormone replacement therapy.  No postmenopausal bleeding.  No pelvic pain.  No pain with intercourse.  Urine and bowel movements normal.  Breasts normal.  Had a right breast lumpectomy in February 2019 showing fibrocystic changes, benign.  Body mass index 27.64.  Exercises regularly with yoga, treadmill, and weightlifting.  Will do health labs with family physician.  Past medical history,surgical history, family history and social history were all reviewed and documented in the EPIC chart.  Gynecologic History Patient's last menstrual period was 09/14/2010. Contraception: post menopausal status Last Pap: 10/2016. Results were: Negative Last mammogram: 11/2016. Results were: Left negative.  Right suspicious.  Rt Dx mammo/US.  Rt breast Biopsy/lumpectomy.  Patho 02/2017 Fibrocystic changes, benign. Bone Density: 11/2016 Normal Colonoscopy: 2015, on a 5 yr schedule  Obstetric History OB History  Gravida Para Term Preterm AB Living  0 0 0 0 0 0  SAB TAB Ectopic Multiple Live Births  0 0 0 0 0     ROS: A ROS was performed and pertinent positives and negatives are included in the history.  GENERAL: No fevers or chills. HEENT: No change in vision, no earache, sore throat or sinus congestion. NECK: No pain or stiffness. CARDIOVASCULAR: No chest pain or pressure. No palpitations. PULMONARY: No shortness of breath, cough or wheeze. GASTROINTESTINAL: No abdominal pain, nausea, vomiting or diarrhea, melena or bright red blood per rectum. GENITOURINARY: No urinary frequency, urgency, hesitancy or dysuria. MUSCULOSKELETAL: No joint or muscle pain, no back pain, no recent trauma. DERMATOLOGIC: No rash, no itching, no lesions. ENDOCRINE: No polyuria, polydipsia, no heat or cold intolerance. No recent change in weight. HEMATOLOGICAL: No anemia or  easy bruising or bleeding. NEUROLOGIC: No headache, seizures, numbness, tingling or weakness. PSYCHIATRIC: No depression, no loss of interest in normal activity or change in sleep pattern.     Exam:   BP 130/80   Ht 5\' 4"  (1.626 m)   Wt 161 lb (73 kg)   LMP 09/14/2010   BMI 27.64 kg/m   Body mass index is 27.64 kg/m.  General appearance : Well developed well nourished female. No acute distress HEENT: Eyes: no retinal hemorrhage or exudates,  Neck supple, trachea midline, no carotid bruits, no thyroidmegaly Lungs: Clear to auscultation, no rhonchi or wheezes, or rib retractions  Heart: Regular rate and rhythm, no murmurs or gallops Breast:Examined in sitting and supine position were symmetrical in appearance, no palpable masses or tenderness,  no skin retraction, no nipple inversion, no nipple discharge, no skin discoloration, no axillary or supraclavicular lymphadenopathy Abdomen: no palpable masses or tenderness, no rebound or guarding Extremities: no edema or skin discoloration or tenderness  Pelvic: Vulva: Normal             Vagina: No gross lesions or discharge  Cervix: No gross lesions or discharge  Uterus  AV, normal size, shape and consistency, non-tender and mobile  Adnexa  Without masses or tenderness  Anus: Normal   Assessment/Plan:  57 y.o. female for annual exam   1. Encounter for routine gynecological examination with Papanicolaou smear of cervix Normal gynecologic exam and menopause.  Pap test in October 2018 was negative.  Will repeat Pap test at 2 to 3 years.  Breast exam normal.  Screening mammogram scheduled for November 2019.  Will do health labs with family  physician.  Next colonoscopy in 2020.  2. Postmenopausal Well on no hormone replacement therapy.  No postmenopausal bleeding.  Bone density was normal in November 2018.  Recommend repeat bone density at 5 years.  Recommend vitamin D supplements, calcium intake of 1.5 g/day and regular weight bearing  physical activity.  3. Overweight (BMI 25.0-29.9) Recommend lower calorie/carb diet such as [employer].  Continue with aerobic physical activity 5 times a week and weightlifting every 2 days.  Princess Bruins MD, 10:27 AM 11/20/2017

## 2017-11-20 NOTE — Addendum Note (Signed)
Addended by: Thurnell Garbe A on: 11/20/2017 11:44 AM   Modules accepted: Orders

## 2017-11-20 NOTE — Patient Instructions (Signed)
1. Encounter for routine gynecological examination with Papanicolaou smear of cervix Normal gynecologic exam and menopause.  Pap test in October 2018 was negative.  Will repeat Pap test at 2 to 3 years.  Breast exam normal.  Screening mammogram scheduled for November 2019.  Will do health labs with family physician.  Next colonoscopy in 2020.  2. Postmenopausal Well on no hormone replacement therapy.  No postmenopausal bleeding.  Bone density was normal in November 2018.  Recommend repeat bone density at 5 years.  Recommend vitamin D supplements, calcium intake of 1.5 g/day and regular weight bearing physical activity.  3. Overweight (BMI 25.0-29.9) Recommend lower calorie/carb diet such as [employer].  Continue with aerobic physical activity 5 times a week and weightlifting every 2 days.  Kelli Moore, it was a pleasure seeing you today!

## 2017-12-15 ENCOUNTER — Other Ambulatory Visit: Payer: Self-pay | Admitting: Obstetrics & Gynecology

## 2017-12-26 ENCOUNTER — Ambulatory Visit
Admission: RE | Admit: 2017-12-26 | Discharge: 2017-12-26 | Disposition: A | Discharge status: Home / Self Care | Payer: BLUE CROSS/BLUE SHIELD | Source: Ambulatory Visit | Attending: Obstetrics & Gynecology | Admitting: Obstetrics & Gynecology

## 2017-12-26 DIAGNOSIS — N6310 Unspecified lump in the right breast, unspecified quadrant: Secondary | ICD-10-CM

## 2017-12-26 DIAGNOSIS — Z1231 Encounter for screening mammogram for malignant neoplasm of breast: Secondary | ICD-10-CM

## 2017-12-31 DIAGNOSIS — R0981 Nasal congestion: Secondary | ICD-10-CM | POA: Insufficient documentation

## 2017-12-31 DIAGNOSIS — J310 Chronic rhinitis: Secondary | ICD-10-CM | POA: Insufficient documentation

## 2018-01-22 HISTORY — PX: ABDOMINAL SURGERY: SHX537

## 2018-02-05 ENCOUNTER — Telehealth: Payer: Self-pay | Admitting: *Deleted

## 2018-02-05 NOTE — Telephone Encounter (Signed)
Patient called had annual exam on 11/20/17, she has been using compound estrogen cream 0.02% at [facility]. She would like refills on Rx. Okay to provide refills until annual exam Oct. 2020?

## 2018-02-07 NOTE — Telephone Encounter (Signed)
Agree with refill of compound Estrogen cream.

## 2018-02-10 MED ORDER — NONFORMULARY OR COMPOUNDED ITEM: 2 refills | Status: DC

## 2018-02-10 NOTE — Telephone Encounter (Signed)
Rx called in, patient aware.  

## 2018-03-31 ENCOUNTER — Encounter: Payer: Self-pay | Admitting: Podiatry

## 2018-03-31 ENCOUNTER — Ambulatory Visit (INDEPENDENT_AMBULATORY_CARE_PROVIDER_SITE_OTHER): Payer: BLUE CROSS/BLUE SHIELD | Admitting: Podiatry

## 2018-03-31 DIAGNOSIS — M722 Plantar fascial fibromatosis: Secondary | ICD-10-CM

## 2018-03-31 MED ORDER — TRIAMCINOLONE ACETONIDE 10 MG/ML IJ SUSP
10.0000 mg | Freq: Once | INTRAMUSCULAR | Status: AC
  Administered 2018-03-31: 10 mg

## 2018-03-31 MED ORDER — DICLOFENAC SODIUM 75 MG PO TBEC: 75.0000 mg | DELAYED_RELEASE_TABLET | Freq: Two times a day (BID) | ORAL | 2 refills | Status: DC

## 2018-04-01 ENCOUNTER — Telehealth: Payer: Self-pay | Admitting: Podiatry

## 2018-04-01 NOTE — Telephone Encounter (Signed)
Pt called and left message for me to call her back about the orthotics she was measured for yesterday.  I returned call and she wants to proceed with ordering the orthotics.

## 2018-04-02 NOTE — Progress Notes (Signed)
Subjective:   Patient ID: Kelli Moore, female   DOB: 58 y.o.   MRN: [id number]   HPI Patient presents stating she was doing okay and then she had a flareup after going to [place] in December.  States that her heel is quite sore   ROS      Objective:  Physical Exam  Neurovascular status intact with patient's right heel being very inflamed in the medial side at the insertion calcaneus with depression of the arch noted     Assessment:  Acute plantar fasciitis right with inflammation with foot structure as part of the pathology     Plan:  H&P condition reviewed sterile prep applied to the medial side of the foot and injected with 3 mg Kenalog 5 mg Xylocaine and advised on orthotic therapy with casting provided today

## 2018-04-08 ENCOUNTER — Other Ambulatory Visit: Payer: Self-pay | Admitting: Obstetrics & Gynecology

## 2018-04-28 ENCOUNTER — Other Ambulatory Visit: Payer: BLUE CROSS/BLUE SHIELD | Admitting: Orthotics

## 2018-05-01 ENCOUNTER — Other Ambulatory Visit: Payer: BLUE CROSS/BLUE SHIELD | Admitting: Orthotics

## 2018-05-26 ENCOUNTER — Other Ambulatory Visit: Payer: Self-pay

## 2018-05-26 ENCOUNTER — Ambulatory Visit: Payer: BLUE CROSS/BLUE SHIELD | Admitting: Orthotics

## 2018-05-26 DIAGNOSIS — M722 Plantar fascial fibromatosis: Secondary | ICD-10-CM

## 2018-05-26 NOTE — Progress Notes (Signed)
Patient came in today to p/up functional foot orthotics.   The orthotics were assessed to both fit and function.  The F/O addressed the biomechanical issues/pathologies as intended, offering good longitudinal arch support, proper offloading, and foot support. There weren't any signs of discomfort or irritation.  The F/O fit properly in footwear with minimal trimming/adjustments. 

## 2018-06-23 ENCOUNTER — Other Ambulatory Visit: Payer: Self-pay | Admitting: Podiatry

## 2018-06-23 MED ORDER — DICLOFENAC SODIUM 75 MG PO TBEC: 75.0000 mg | DELAYED_RELEASE_TABLET | Freq: Two times a day (BID) | ORAL | 2 refills | Status: DC

## 2018-08-29 ENCOUNTER — Other Ambulatory Visit: Payer: Self-pay

## 2018-08-29 DIAGNOSIS — Z20822 Contact with and (suspected) exposure to covid-19: Secondary | ICD-10-CM

## 2018-08-30 LAB — NOVEL CORONAVIRUS, NAA: SARS-CoV-2, NAA: NOT DETECTED

## 2018-10-20 ENCOUNTER — Other Ambulatory Visit: Payer: Self-pay | Admitting: Obstetrics & Gynecology

## 2018-10-20 DIAGNOSIS — Z1231 Encounter for screening mammogram for malignant neoplasm of breast: Secondary | ICD-10-CM

## 2018-10-21 ENCOUNTER — Encounter: Payer: Self-pay | Admitting: Gynecology

## 2018-11-04 ENCOUNTER — Other Ambulatory Visit: Payer: Self-pay | Admitting: Podiatry

## 2018-11-24 ENCOUNTER — Other Ambulatory Visit: Payer: Self-pay

## 2018-11-24 MED ORDER — NONFORMULARY OR COMPOUNDED ITEM: 0 refills | Status: DC

## 2018-11-25 ENCOUNTER — Other Ambulatory Visit: Payer: Self-pay

## 2018-11-26 ENCOUNTER — Encounter: Payer: Self-pay | Admitting: Obstetrics & Gynecology

## 2018-11-26 ENCOUNTER — Ambulatory Visit (INDEPENDENT_AMBULATORY_CARE_PROVIDER_SITE_OTHER): Payer: BC Managed Care – PPO | Admitting: Obstetrics & Gynecology

## 2018-11-26 ENCOUNTER — Other Ambulatory Visit: Payer: Self-pay

## 2018-11-26 VITALS — BP 140/86 | Ht 63.5 in | Wt 154.0 lb

## 2018-11-26 DIAGNOSIS — Z780 Asymptomatic menopausal state: Secondary | ICD-10-CM

## 2018-11-26 DIAGNOSIS — Z20822 Contact with and (suspected) exposure to covid-19: Secondary | ICD-10-CM

## 2018-11-26 DIAGNOSIS — N952 Postmenopausal atrophic vaginitis: Secondary | ICD-10-CM

## 2018-11-26 DIAGNOSIS — Z01419 Encounter for gynecological examination (general) (routine) without abnormal findings: Secondary | ICD-10-CM | POA: Diagnosis not present

## 2018-11-26 NOTE — Patient Instructions (Signed)
1. Well female exam with routine gynecological exam Normal gynecologic exam in menopause.  Pap test in October 2018 was negative, no indication to repeat this year.  Breast exam normal.  Screening mammogram December 2019 was negative.  Will schedule [facility] this year.  Body mass index 26.85.  Good fitness and healthy nutrition. Health labs with [facility] physician.    2. Postmenopausal Well on no HRT.  No PMB.  Bone Density normal in 2018.  Vit D supplements, Ca++ 1200 mg daily, regular weightbearing physical activity.  3. Postmenopausal atrophic vaginitis Improved on Compound vaginal Estradiol cream.  Prescription sent to [facility].  Other orders - vitamin B-12 (CYANOCOBALAMIN) 500 MCG tablet; Take 500 mcg by mouth daily. - OVER THE COUNTER MEDICATION; Kelli Moore, it was a pleasure seeing you today!

## 2018-11-26 NOTE — Progress Notes (Signed)
Tarsha J Martinique 06-Jan-1961 [id number]   History:    58 y.o. G0 Married  RP:  Established patient presenting for annual gyn exam   HPI: Menopause, well on no systemic hormone replacement therapy.  No postmenopausal bleeding.  No pelvic pain.  Using compound estradiol cream vaginally to help with intercourse.  Urine and bowel movements normal.  Breasts normal.  Body mass index improved at 26.85.  Good fitness and healthy nutrition.  Health labs with family physician.  Past medical history,surgical history, family history and social history were all reviewed and documented in the EPIC chart.  Gynecologic History Patient's last menstrual period was 09/14/2010. Contraception: post menopausal status Last Pap: 10/2016. Results were: Negative Last mammogram: 12/2017. Results were: Negative Bone Density: 11/2016 normal Colonoscopy: 2015.  On a 5-year schedule, will schedule this year.  Obstetric History OB History  Gravida Para Term Preterm AB Living  0 0 0 0 0 0  SAB TAB Ectopic Multiple Live Births  0 0 0 0 0     ROS: A ROS was performed and pertinent positives and negatives are included in the history.  GENERAL: No fevers or chills. HEENT: No change in vision, no earache, sore throat or sinus congestion. NECK: No pain or stiffness. CARDIOVASCULAR: No chest pain or pressure. No palpitations. PULMONARY: No shortness of breath, cough or wheeze. GASTROINTESTINAL: No abdominal pain, nausea, vomiting or diarrhea, melena or bright red blood per rectum. GENITOURINARY: No urinary frequency, urgency, hesitancy or dysuria. MUSCULOSKELETAL: No joint or muscle pain, no back pain, no recent trauma. DERMATOLOGIC: No rash, no itching, no lesions. ENDOCRINE: No polyuria, polydipsia, no heat or cold intolerance. No recent change in weight. HEMATOLOGICAL: No anemia or easy bruising or bleeding. NEUROLOGIC: No headache, seizures, numbness, tingling or weakness. PSYCHIATRIC: No depression, no loss of interest  in normal activity or change in sleep pattern.     Exam:   BP 140/86   Ht 5' 3.5" (1.613 m)   Wt 154 lb (69.9 kg)   LMP 09/14/2010   BMI 26.85 kg/m   Body mass index is 26.85 kg/m.  General appearance : Well developed well nourished female. No acute distress HEENT: Eyes: no retinal hemorrhage or exudates,  Neck supple, trachea midline, no carotid bruits, no thyroidmegaly Lungs: Clear to auscultation, no rhonchi or wheezes, or rib retractions  Heart: Regular rate and rhythm, no murmurs or gallops Breast:Examined in sitting and supine position were symmetrical in appearance, no palpable masses or tenderness,  no skin retraction, no nipple inversion, no nipple discharge, no skin discoloration, no axillary or supraclavicular lymphadenopathy Abdomen: no palpable masses or tenderness, no rebound or guarding Extremities: no edema or skin discoloration or tenderness  Pelvic: Vulva: Normal             Vagina: No gross lesions or discharge  Cervix: No gross lesions or discharge  Uterus  AV, normal size, shape and consistency, non-tender and mobile  Adnexa  Without masses or tenderness  Anus: Normal   Assessment/Plan:  58 y.o. female for annual exam   1. Well female exam with routine gynecological exam Normal gynecologic exam in menopause.  Pap test in October 2018 was negative, no indication to repeat this year.  Breast exam normal.  Screening mammogram December 2019 was negative.  Will schedule [facility] this year.  Body mass index 26.85.  Good fitness and healthy nutrition. Health labs with [facility] physician.    2. Postmenopausal Well on no HRT.  No PMB.  Bone Density  normal in 2018.  Vit D supplements, Ca++ 1200 mg daily, regular weightbearing physical activity.  3. Postmenopausal atrophic vaginitis Improved on Compound vaginal Estradiol cream.  Prescription sent to [facility].  Other orders - vitamin B-12 (CYANOCOBALAMIN) 500 MCG tablet; Take 500 mcg by mouth daily. - OVER THE  COUNTER MEDICATION; AMBEREN- OTC SUPPLEMENT  Princess Bruins MD, 3:16 PM 11/26/2018

## 2018-11-27 ENCOUNTER — Other Ambulatory Visit: Payer: Self-pay

## 2018-11-27 LAB — NOVEL CORONAVIRUS, NAA: SARS-CoV-2, NAA: NOT DETECTED

## 2018-11-27 MED ORDER — NONFORMULARY OR COMPOUNDED ITEM: 4 refills | Status: DC

## 2018-12-03 ENCOUNTER — Telehealth: Payer: Self-pay | Admitting: *Deleted

## 2018-12-03 NOTE — Telephone Encounter (Signed)
Pt questioned the COVID order being on her AVS.  I advised her that the AVS's print ordered created that day.  The ordering provider was through the [facility].  She understands now why it was on her AVS. KW CMA

## 2018-12-03 NOTE — Telephone Encounter (Signed)
Pt left a VM wanting to discuss visit from 11/26/18 with ML. I left a message for pt to call me back KW CMA

## 2018-12-24 ENCOUNTER — Other Ambulatory Visit: Payer: Self-pay | Admitting: Obstetrics & Gynecology

## 2018-12-29 ENCOUNTER — Ambulatory Visit
Admission: RE | Admit: 2018-12-29 | Discharge: 2018-12-29 | Disposition: A | Discharge status: Home / Self Care | Payer: BC Managed Care – PPO | Source: Ambulatory Visit | Attending: Obstetrics & Gynecology | Admitting: Obstetrics & Gynecology

## 2018-12-29 ENCOUNTER — Other Ambulatory Visit: Payer: Self-pay

## 2018-12-29 DIAGNOSIS — Z1231 Encounter for screening mammogram for malignant neoplasm of breast: Secondary | ICD-10-CM

## 2018-12-30 ENCOUNTER — Other Ambulatory Visit: Payer: Self-pay | Admitting: Obstetrics & Gynecology

## 2018-12-30 DIAGNOSIS — R928 Other abnormal and inconclusive findings on diagnostic imaging of breast: Secondary | ICD-10-CM

## 2019-01-05 ENCOUNTER — Ambulatory Visit
Admission: RE | Admit: 2019-01-05 | Discharge: 2019-01-05 | Disposition: A | Discharge status: Home / Self Care | Payer: BC Managed Care – PPO | Source: Ambulatory Visit | Attending: Obstetrics & Gynecology | Admitting: Obstetrics & Gynecology

## 2019-01-05 ENCOUNTER — Other Ambulatory Visit: Payer: Self-pay

## 2019-01-05 DIAGNOSIS — R928 Other abnormal and inconclusive findings on diagnostic imaging of breast: Secondary | ICD-10-CM

## 2019-01-12 ENCOUNTER — Ambulatory Visit
Admission: RE | Admit: 2019-01-12 | Discharge: 2019-01-12 | Disposition: A | Discharge status: Home / Self Care | Payer: Self-pay | Source: Ambulatory Visit | Attending: Cardiovascular Disease | Admitting: Cardiovascular Disease

## 2019-01-12 ENCOUNTER — Other Ambulatory Visit: Payer: Self-pay

## 2019-01-12 DIAGNOSIS — R002 Palpitations: Secondary | ICD-10-CM

## 2019-01-12 NOTE — Progress Notes (Signed)
Per Dr. Johnsie Cancel okay to order calcium score for patient.

## 2019-01-20 ENCOUNTER — Ambulatory Visit: Payer: BC Managed Care – PPO | Attending: Internal Medicine

## 2019-01-20 DIAGNOSIS — Z20822 Contact with and (suspected) exposure to covid-19: Secondary | ICD-10-CM

## 2019-01-21 LAB — NOVEL CORONAVIRUS, NAA: SARS-CoV-2, NAA: NOT DETECTED

## 2019-03-30 ENCOUNTER — Ambulatory Visit: Payer: BC Managed Care – PPO

## 2019-04-13 ENCOUNTER — Ambulatory Visit: Payer: Self-pay | Attending: Internal Medicine

## 2019-04-13 ENCOUNTER — Ambulatory Visit: Payer: BC Managed Care – PPO

## 2019-04-13 DIAGNOSIS — Z23 Encounter for immunization: Secondary | ICD-10-CM

## 2019-04-13 NOTE — Progress Notes (Signed)
   Covid-19 Vaccination Clinic  Name:  Kelli Moore    MRN: [id number] DOB: 27-Mar-1960  04/13/2019  Kelli Moore was observed post Covid-19 immunization for 15 minutes without incident. She was provided with Vaccine Information Sheet and instruction to access the V-Safe system.   Kelli Moore was instructed to call 911 with any severe reactions post vaccine: Marland Kitchen Difficulty breathing  . Swelling of face and throat  . A fast heartbeat  . A bad rash all over body  . Dizziness and weakness   Immunizations Administered    Name Date Dose VIS Date Route   Pfizer COVID-19 Vaccine 04/13/2019 11:04 AM 0.3 mL 01/02/2019 Intramuscular   Manufacturer: [place]   Lot: [id number]   [place]: [id number]

## 2019-05-06 ENCOUNTER — Ambulatory Visit: Payer: Self-pay | Attending: Internal Medicine

## 2019-05-06 DIAGNOSIS — Z23 Encounter for immunization: Secondary | ICD-10-CM

## 2019-05-06 NOTE — Progress Notes (Signed)
   Covid-19 Vaccination Clinic  Name:  Kelli Moore    MRN: [id number] DOB: Apr 12, 1960  05/06/2019  Ms. Moore was observed post Covid-19 immunization for 15 minutes without incident. She was provided with Vaccine Information Sheet and instruction to access the V-Safe system.   Ms. Moore was instructed to call 911 with any severe reactions post vaccine: Marland Kitchen Difficulty breathing  . Swelling of face and throat  . A fast heartbeat  . A bad rash all over body  . Dizziness and weakness   Immunizations Administered    Name Date Dose VIS Date Route   Pfizer COVID-19 Vaccine 05/06/2019 11:45 AM 0.3 mL 01/02/2019 Intramuscular   Manufacturer: [place]   Lot: [id number]   [place]: [id number]

## 2019-07-07 ENCOUNTER — Encounter (HOSPITAL_COMMUNITY): Payer: Self-pay

## 2019-07-07 ENCOUNTER — Other Ambulatory Visit: Payer: Self-pay

## 2019-07-07 ENCOUNTER — Emergency Department (HOSPITAL_COMMUNITY)
Admission: EM | Admit: 2019-07-07 | Discharge: 2019-07-07 | Disposition: A | Discharge status: Home / Self Care | Payer: 59 | Attending: Emergency Medicine | Admitting: Emergency Medicine

## 2019-07-07 DIAGNOSIS — B9689 Other specified bacterial agents as the cause of diseases classified elsewhere: Secondary | ICD-10-CM | POA: Diagnosis not present

## 2019-07-07 DIAGNOSIS — R300 Dysuria: Secondary | ICD-10-CM | POA: Insufficient documentation

## 2019-07-07 DIAGNOSIS — N390 Urinary tract infection, site not specified: Secondary | ICD-10-CM

## 2019-07-07 DIAGNOSIS — R1030 Lower abdominal pain, unspecified: Secondary | ICD-10-CM | POA: Insufficient documentation

## 2019-07-07 LAB — URINALYSIS, MICROSCOPIC (REFLEX): WBC, UA: >50 WBC/hpf (ref 0–5)

## 2019-07-07 LAB — URINALYSIS, ROUTINE W REFLEX MICROSCOPIC
Bilirubin Urine: NEGATIVE
Glucose, UA: NEGATIVE mg/dL
Ketones, ur: NEGATIVE mg/dL
Nitrite: NEGATIVE
Protein, ur: NEGATIVE mg/dL
Specific Gravity, Urine: <1.005 — ABNORMAL LOW (ref 1.005–1.030)
pH: 6 (ref 5.0–8.0)

## 2019-07-07 MED ORDER — PHENAZOPYRIDINE HCL 200 MG PO TABS: 200.0000 mg | ORAL_TABLET | Freq: Three times a day (TID) | ORAL | 0 refills | Status: DC

## 2019-07-07 MED ORDER — PHENAZOPYRIDINE HCL 200 MG PO TABS
200.0000 mg | ORAL_TABLET | Freq: Three times a day (TID) | ORAL | Status: DC
  Administered 2019-07-07: 200 mg via ORAL
  Filled 2019-07-07: qty 1

## 2019-07-07 MED ORDER — SULFAMETHOXAZOLE-TRIMETHOPRIM 800-160 MG PO TABS
1.0000 | ORAL_TABLET | Freq: Once | ORAL | Status: AC
  Administered 2019-07-07: 1 via ORAL
  Filled 2019-07-07: qty 1

## 2019-07-07 MED ORDER — SULFAMETHOXAZOLE-TRIMETHOPRIM 800-160 MG PO TABS: 1.0000 | ORAL_TABLET | Freq: Two times a day (BID) | ORAL | 0 refills | Status: AC

## 2019-07-07 NOTE — ED Triage Notes (Signed)
Patient arrived stating a few hours ago she started to feel like she has a UTI. Reports the feeling of frequency to urinate.

## 2019-07-07 NOTE — Discharge Instructions (Signed)
Take the medication as prescribed.  Return for new or worsening symptoms. 

## 2019-07-07 NOTE — ED Provider Notes (Signed)
[facility] DEPT Provider Note   CSN: [id number] Arrival date & time: 07/07/19  0234   History Chief Complaint  Patient presents with  . Urinary Tract Infection   Kelli Moore is a 59 y.o. female with no significant past medical history who presents for evaluation of dysuria and frequency with urination.  Symptoms started about 4 hours ago.  She is sexually active and uses protection.  No abdominal pain, pelvic pain, vaginal discharge or concerns for STDs.  Denies fever, headache, lightheadedness, dizziness, chest pain, shortness of breath, diarrhea, constipation, melena, hematochezia.  Denies hematuria, vaginal bleeding.  No rashes or lesions.  Has not take anything for symptoms.  States similar symptoms with prior UTIs. No midline abd pain radiating into back. Rates her pain an 8/10 with urination.  No prior history of kidney stones, she is postmenopausal.  Denies additional aggravating or alleviating factors.  History obtained from patient and past medical records.  No interpreter is used.  HPI     Past Medical History:  Diagnosis Date  . Anxiety   . Depression   . GERD (gastroesophageal reflux disease)   . Mass of right breast 02/2017    Patient Active Problem List   Diagnosis Date Noted  . Chronic nasal congestion 12/31/2017  . Rhinitis, chronic 12/31/2017  . Plantar fasciitis of right foot 10/14/2017  . Depression 10/10/2017  . Mass of right breast 02/22/2017  . De Quervain's disease (radial styloid tenosynovitis) 09/12/2015  . Cat bite 06/22/2015  . Cellulitis of left upper extremity 06/22/2015  . Major depressive disorder, single episode 06/22/2015  . Carpal tunnel syndrome on right 12/13/2014  . Trigger thumb of right hand 12/13/2014  . Palpitations 04/14/2014    Past Surgical History:  Procedure Laterality Date  . ABDOMINAL SURGERY  2020   LIPOSUCTION  . BREAST BIOPSY Right   . BREAST LUMPECTOMY WITH RADIOACTIVE SEED  LOCALIZATION Right 03/15/2017   Procedure: RIGHT BREAST LUMPECTOMY WITH RADIOACTIVE SEED LOCALIZATION ERAS PATHWAY;  Surgeon: Jovita Kussmaul, MD;  Location: [facility];  Service: General;  Laterality: Right;  . BREAST SURGERY  02/2017   RIGHT BREAST LUMPECTOMY  . BUNIONECTOMY Right 2016  . CARPAL TUNNEL RELEASE Right 11/16/2014   Procedure: CARPAL TUNNEL RELEASE;  Surgeon: Daryll Brod, MD;  Location: [place];  Service: Orthopedics;  Laterality: Right;  . DIAGNOSTIC LAPAROSCOPY    . METATARSAL OSTEOTOMY WITH BUNIONECTOMY Left 01/29/2017  . TRIGGER FINGER RELEASE Right 11/16/2014   Procedure: RELEASE TRIGGER FINGER/A-1 PULLEY;  Surgeon: Daryll Brod, MD;  Location: [place];  Service: Orthopedics;  Laterality: Right;     OB History    Gravida  0   Para  0   Term  0   Preterm  0   AB  0   Living  0     SAB  0   TAB  0   Ectopic  0   Multiple  0   Live Births  0           Family History  Problem Relation Age of Onset  . Cancer Mother        colon  . Hypertension Father   . Cancer Maternal Aunt        ovarian  . Cancer Maternal Uncle        liver  . Stroke Maternal Grandfather   . Cancer Maternal Uncle        liver  . Breast  cancer Neg Hx     Social History   Tobacco Use  . Smoking status: Never Smoker  . Smokeless tobacco: Never Used  Vaping Use  . Vaping Use: Never used  Substance Use Topics  . Alcohol use: Yes    Comment: occasionally  . Drug use: No    Home Medications Prior to Admission medications   Medication Sig Start Date End Date Taking? Authorizing Provider  acidophilus (RISAQUAD) CAPS capsule Take by mouth daily.    [provider]  calcium carbonate (TUMS - DOSED IN MG ELEMENTAL CALCIUM) 500 MG chewable tablet Chew 1 tablet by mouth as needed for indigestion or heartburn.    [provider]  diclofenac (VOLTAREN) 75 MG EC tablet TAKE 1 TABLET BY MOUTH TWICE DAILY. 06/24/18    Wallene Huh, DPM  FLUoxetine (PROZAC) 20 MG capsule TAKE 1 CAPSULE DAILY. 12/24/18   Princess Bruins, MD  fluticasone (FLONASE) 50 MCG/ACT nasal spray Place into the nose. 12/30/17   [provider]  Melatonin 1 MG TABS Take by mouth.    [provider]  Multiple Vitamin (MULTIVITAMIN) tablet Take 1 tablet by mouth daily.    [provider]  NONFORMULARY OR COMPOUNDED ITEM Estradiol 0.02% Vaginal cream insert one ml. Intravaginally twice weekly 11/27/18   Princess Bruins, MD  OVER THE COUNTER MEDICATION AMBEREN- OTC SUPPLEMENT    [provider]  phenazopyridine (PYRIDIUM) 200 MG tablet Take 1 tablet (200 mg total) by mouth 3 (three) times daily. 07/07/19   Jora Galluzzo A, PA-C  sulfamethoxazole-trimethoprim (BACTRIM DS) 800-160 MG tablet Take 1 tablet by mouth 2 (two) times daily for 7 days. 07/07/19 07/14/19  Jerolyn Flenniken A, PA-C  vitamin B-12 (CYANOCOBALAMIN) 500 MCG tablet Take 500 mcg by mouth daily.    [provider]    Allergies    Patient has no known allergies.  Review of Systems   Review of Systems  Constitutional: Negative.   HENT: Negative.   Respiratory: Negative.   Cardiovascular: Negative.   Gastrointestinal: Negative.   Genitourinary: Positive for dysuria, frequency and urgency. Negative for decreased urine volume, difficulty urinating, flank pain, genital sores, hematuria, menstrual problem, pelvic pain, vaginal bleeding, vaginal discharge and vaginal pain.  Musculoskeletal: Negative.   Skin: Negative.   Neurological: Negative.   All other systems reviewed and are negative.   Physical Exam Updated Vital Signs BP 138/80 (BP Location: Right Arm)   Pulse 85   Temp 97.7 F (36.5 C) (Oral)   Resp 16   LMP 09/14/2010   SpO2 100%   Physical Exam Vitals and nursing note reviewed.  Constitutional:      General: She is not in acute distress.    Appearance: She is well-developed. She is not ill-appearing or  toxic-appearing.  HENT:     Head: Normocephalic and atraumatic.     Nose: Nose normal.     Mouth/Throat:     Mouth: Mucous membranes are moist.     Pharynx: Oropharynx is clear.  Eyes:     Pupils: Pupils are equal, round, and reactive to light.  Cardiovascular:     Rate and Rhythm: Normal rate.     Pulses: Normal pulses.     Heart sounds: Normal heart sounds.  Pulmonary:     Effort: Pulmonary effort is normal. No respiratory distress.     Breath sounds: Normal breath sounds.  Abdominal:     General: Bowel sounds are normal. There is no distension.     Palpations:  Abdomen is soft. There is no mass.     Tenderness: There is abdominal tenderness in the suprapubic area. There is no right CVA tenderness, left CVA tenderness, guarding or rebound. Negative signs include Murphy's sign and McBurney's sign.     Hernia: No hernia is present.    Musculoskeletal:        General: Normal range of motion.     Cervical back: Normal range of motion.     Comments: Moves all 4 extremities at difficulty.  Compartments soft. No bony tenderness.  Skin:    General: Skin is warm and dry.     Capillary Refill: Capillary refill takes less than 2 seconds.     Comments: No edema, erythema or warmth.  Neurological:     Mental Status: She is alert.     ED Results / Procedures / Treatments   Labs (all labs ordered are listed, but only abnormal results are displayed) Labs Reviewed  URINALYSIS, ROUTINE W REFLEX MICROSCOPIC - Abnormal; Notable for the following components:      Result Value   Specific Gravity, Urine <1.005 (*)    Hgb urine dipstick LARGE (*)    Leukocytes,Ua LARGE (*)    All other components within normal limits  URINALYSIS, MICROSCOPIC (REFLEX) - Abnormal; Notable for the following components:   Bacteria, UA RARE (*)    All other components within normal limits  URINE CULTURE    EKG None  Radiology No results found.  Procedures Procedures (including critical care  time)  Medications Ordered in ED Medications  phenazopyridine (PYRIDIUM) tablet 200 mg (200 mg Oral Given 07/07/19 0504)  sulfamethoxazole-trimethoprim (BACTRIM DS) 800-160 MG per tablet 1 tablet (1 tablet Oral Given 07/07/19 0504)   ED Course  I have reviewed the triage vital signs and the nursing notes.  Pertinent labs & imaging results that were available during my care of the patient were reviewed by me and considered in my medical decision making (see chart for details).  59 year old presents for evaluation of dysuria and urinary frequency.  States she woke up 4 hours ago when symptoms began.  She has some mild suprapubic discomfort which began at the same time as her dysuria and urinary frequency.  Her abdomen is soft, nontender.  She has no midline pulsatile abdominal mass.  She has no abdominal pain radiating to her back.  She is menopausal.  She denies any sharp, stabbing pain to her left or right lower quadrant. She is afebrile, nonseptic, non-ill-appearing.  Heart and lungs clear.  She is without tachycardia, tachypnea or hypoxia.  She is tolerating p.o. intake without difficulty.  Urinalysis with large leuks, rare bacteria however greater then 50 WBC.  Urinalysis sent for culture.  We will start antibiotics given given symptomatic.  She has no concerns for STDs.  No diarrhea   On repeat exam patient does not have a surgical abdomin and there are no peritoneal signs.  No indication of appendicitis, bowel obstruction, bowel perforation, cholecystitis, diverticulitis, PID or ectopic pregnancy, dissection, AAA.   The patient has been appropriately medically screened and/or stabilized in the ED. I have low suspicion for any other emergent medical condition which would require further screening, evaluation or treatment in the ED or require inpatient management.  Patient is hemodynamically stable and in no acute distress.  Patient able to ambulate in department prior to ED.  Evaluation does not  show acute pathology that would require ongoing or additional emergent interventions while in the emergency department  or further inpatient treatment.  I have discussed the diagnosis with the patient and answered all questions.  Pain is been managed while in the emergency department and patient has no further complaints prior to discharge.  Patient is comfortable with plan discussed in room and is stable for discharge at this time.  I have discussed strict return precautions for returning to the emergency department.  Patient was encouraged to follow-up with PCP/specialist refer to at discharge.    MDM Rules/Calculators/A&P                           Final Clinical Impression(s) / ED Diagnoses Final diagnoses:  Lower urinary tract infectious disease    Rx / DC Orders ED Discharge Orders         Ordered    phenazopyridine (PYRIDIUM) 200 MG tablet  3 times daily     Discontinue  Reprint     07/07/19 0500    sulfamethoxazole-trimethoprim (BACTRIM DS) 800-160 MG tablet  2 times daily     Discontinue  Reprint     07/07/19 0500           Charlisha Market A, PA-C 07/07/19 0544    Ezequiel Essex, MD 07/07/19 [phone]

## 2019-07-08 LAB — URINE CULTURE: Culture: >=100000 — AB

## 2019-07-09 ENCOUNTER — Telehealth: Payer: Self-pay | Admitting: *Deleted

## 2019-07-09 NOTE — Telephone Encounter (Signed)
Post ED Visit - Positive Culture Follow-up: Successful Patient Follow-Up  Culture assessed and recommendations reviewed by:  []  Elenor Quinones, Pharm.D. []  Heide Guile, Pharm.D., BCPS AQ-ID []  Parks Neptune, Pharm.D., BCPS []  Alycia Rossetti, Pharm.D., BCPS []  [place], [place].D., BCPS, AAHIVP []  Legrand Como, Pharm.D., BCPS, AAHIVP []  Salome Arnt, PharmD, BCPS []  Johnnette Gourd, PharmD, BCPS []  Hughes Better, PharmD, BCPS []  Leeroy Cha, PharmD  Positive urine culture  []  Patient discharged without antimicrobial prescription and treatment is now indicated [x]  Organism is resistant to prescribed ED discharge antimicrobial []  Patient with positive blood cultures  Changes discussed with ED provider Emeterio Reeve, PA New antibiotic prescription Keflex 500mg  PO BID x 5 days, Stop Septra Called to [facility]  Contacted patient, date 07/09/2019, time [place], [place] 07/09/2019, 12:10 PM

## 2019-07-09 NOTE — Progress Notes (Signed)
ED Antimicrobial Stewardship Positive Culture Follow Up   Kelli Moore is an 59 y.o. female who presented to [facility] on 07/07/2019 with a chief complaint of  Chief Complaint  Patient presents with  . Urinary Tract Infection    Recent Results (from the past 720 hour(s))  Urine culture     Status: Abnormal   Collection Time: 07/07/19  2:55 AM   Specimen: Urine, Clean Catch  Result Value Ref Range Status   Specimen Description   Final    URINE, CLEAN CATCH Performed at [facility], [place] [address], [place], [place] [address]    Special Requests   Final    NONE Performed at [facility], [place] [address] [place], [place] [address]    Culture >=100,000 COLONIES/mL ESCHERICHIA COLI (A)  Final   Report Status 07/08/2019 FINAL  Final   Organism ID, Bacteria ESCHERICHIA COLI (A)  Final      Susceptibility   Escherichia coli - MIC*    AMPICILLIN >=32 RESISTANT Resistant     CEFAZOLIN <=4 SENSITIVE Sensitive     CEFTRIAXONE <=0.25 SENSITIVE Sensitive     CIPROFLOXACIN <=0.25 SENSITIVE Sensitive     GENTAMICIN >=16 RESISTANT Resistant     IMIPENEM <=0.25 SENSITIVE Sensitive     NITROFURANTOIN <=16 SENSITIVE Sensitive     TRIMETH/SULFA >=320 RESISTANT Resistant     AMPICILLIN/SULBACTAM >=32 RESISTANT Resistant     PIP/TAZO <=4 SENSITIVE Sensitive     * >=100,000 COLONIES/mL ESCHERICHIA COLI    [x]  Treated with Septra, organism, E coli resistant to prescribed antimicrobial []  Patient discharged originally without antimicrobial agent and treatment is now indicated  New antibiotic prescription: Keflex 500mg  bid x 5 days, discontinue Septra  ED Provider: Emeterio Reeve, PA  Minda Ditto PharmD 07/09/2019, 11:35 AM Clinical Pharmacist [phone]

## 2019-10-12 ENCOUNTER — Other Ambulatory Visit: Payer: Self-pay | Admitting: Obstetrics & Gynecology

## 2019-10-12 DIAGNOSIS — Z1231 Encounter for screening mammogram for malignant neoplasm of breast: Secondary | ICD-10-CM

## 2019-11-27 ENCOUNTER — Encounter: Payer: Self-pay | Admitting: Obstetrics & Gynecology

## 2019-11-27 ENCOUNTER — Ambulatory Visit (INDEPENDENT_AMBULATORY_CARE_PROVIDER_SITE_OTHER): Payer: 59 | Admitting: Obstetrics & Gynecology

## 2019-11-27 ENCOUNTER — Other Ambulatory Visit: Payer: Self-pay

## 2019-11-27 VITALS — BP 120/70 | Ht 63.5 in | Wt 156.0 lb

## 2019-11-27 DIAGNOSIS — Z23 Encounter for immunization: Secondary | ICD-10-CM

## 2019-11-27 DIAGNOSIS — N393 Stress incontinence (female) (male): Secondary | ICD-10-CM | POA: Diagnosis not present

## 2019-11-27 DIAGNOSIS — Z01419 Encounter for gynecological examination (general) (routine) without abnormal findings: Secondary | ICD-10-CM | POA: Diagnosis not present

## 2019-11-27 DIAGNOSIS — Z780 Asymptomatic menopausal state: Secondary | ICD-10-CM

## 2019-11-27 DIAGNOSIS — N952 Postmenopausal atrophic vaginitis: Secondary | ICD-10-CM

## 2019-11-27 DIAGNOSIS — F324 Major depressive disorder, single episode, in partial remission: Secondary | ICD-10-CM

## 2019-11-27 MED ORDER — FLUOXETINE HCL 20 MG PO CAPS: 20.0000 mg | ORAL_CAPSULE | Freq: Every day | ORAL | 12 refills | Status: DC

## 2019-11-27 NOTE — Addendum Note (Signed)
Addended by: Thurnell Garbe A on: 11/27/2019 04:15 PM   Modules accepted: Orders

## 2019-11-27 NOTE — Progress Notes (Signed)
Kelli Moore [birth year] [id number]   History:    59 y.o. G0 Married  RP:  Established patient presenting for annual gyn exam   HPI: Postmenopause, well on no systemic hormone replacement therapy.  No postmenopausal bleeding.  No pelvic pain.  Using compound estradiol cream vaginally to help with intercourse.  Bowel movements normal.  Mild SUI.  Breasts normal.  Body mass index at 27.2.  Good fitness and healthy nutrition.  Health labs with family physician.  Colono 2017.  BD normal 2018.   Past medical history,surgical history, family history and social history were all reviewed and documented in the EPIC chart.  Gynecologic History Patient's last menstrual period was 09/14/2010.  Obstetric History OB History  Gravida Para Term Preterm AB Living  0 0 0 0 0 0  SAB TAB Ectopic Multiple Live Births  0 0 0 0 0     ROS: A ROS was performed and pertinent positives and negatives are included in the history.  GENERAL: No fevers or chills. HEENT: No change in vision, no earache, sore throat or sinus congestion. NECK: No pain or stiffness. CARDIOVASCULAR: No chest pain or pressure. No palpitations. PULMONARY: No shortness of breath, cough or wheeze. GASTROINTESTINAL: No abdominal pain, nausea, vomiting or diarrhea, melena or bright red blood per rectum. GENITOURINARY: No urinary frequency, urgency, hesitancy or dysuria. MUSCULOSKELETAL: No joint or muscle pain, no back pain, no recent trauma. DERMATOLOGIC: No rash, no itching, no lesions. ENDOCRINE: No polyuria, polydipsia, no heat or cold intolerance. No recent change in weight. HEMATOLOGICAL: No anemia or easy bruising or bleeding. NEUROLOGIC: No headache, seizures, numbness, tingling or weakness. PSYCHIATRIC: No depression, no loss of interest in normal activity or change in sleep pattern.     Exam:   BP 120/70   Ht 5' 3.5" (1.613 m)   Wt 156 lb (70.8 kg)   LMP 09/14/2010   BMI 27.20 kg/m   Body mass index is 27.2  kg/m.  General appearance : Well developed well nourished female. No acute distress HEENT: Eyes: no retinal hemorrhage or exudates,  Neck supple, trachea midline, no carotid bruits, no thyroidmegaly Lungs: Clear to auscultation, no rhonchi or wheezes, or rib retractions  Heart: Regular rate and rhythm, no murmurs or gallops Breast:Examined in sitting and supine position were symmetrical in appearance, no palpable masses or tenderness,  no skin retraction, no nipple inversion, no nipple discharge, no skin discoloration, no axillary or supraclavicular lymphadenopathy Abdomen: no palpable masses or tenderness, no rebound or guarding Extremities: no edema or skin discoloration or tenderness  Pelvic: Vulva: Normal             Vagina: No gross lesions or discharge  Cervix: No gross lesions or discharge.  Pap reflex done.  Uterus  AV, normal size, shape and consistency, non-tender and mobile  Adnexa  Without masses or tenderness  Anus: Normal   Assessment/Plan:  59 y.o. female for annual exam   1. Encounter for routine gynecological examination with Papanicolaou smear of cervix Normal gynecologic exam in menopause.  Pap reflex done.  Breast exam normal.  Screening mammogram scheduled for December 2021.  Colonoscopy 2017, will repeat next year.  Health labs with family physician.  2. Postmenopausal Postmenopausal, well on no systemic hormone replacement therapy.  Bone density normal in 2018, will repeat at age 70.  Vitamin D supplements, calcium intake of 1500 mg daily and regular weightbearing physical activities to continue.    3. Postmenopausal atrophic vaginitis Well on compound estradiol  cream twice weekly.  No contraindication to continue.  We will send prescription to [facility].  4. SUI (stress urinary incontinence, female) Mild stress urinary incontinence.  Recommend Kegel exercises.  Counseling on Kegel exercises done.  If no improvement, patient will call to be referred to  physical therapy for pelvic floor reinforcement.  Recommend emptying the bladder more often to avoid overfilling and avoiding bladder irritants such as caffeine.  Surgical correction with sling procedure not justified at this time.  5. Major depressive disorder with single episode, in partial remission (HCC) Stable on fluoxetine 20 mg/caps, 1 capsule daily.  No suicidal ideation.  Prescription sent to pharmacy.  Other orders - FLUoxetine (PROZAC) 20 MG capsule; Take 1 capsule (20 mg total) by mouth daily.  Princess Bruins MD, 4:04 PM 11/27/2019

## 2019-11-30 ENCOUNTER — Telehealth: Payer: Self-pay | Admitting: *Deleted

## 2019-11-30 MED ORDER — NONFORMULARY OR COMPOUNDED ITEM: 4 refills | Status: DC

## 2019-11-30 NOTE — Telephone Encounter (Signed)
-----   Message from Princess Bruins, MD sent at 11/27/2019  4:02 PM EDT ----- Regarding: [place] Compound Estradiol cream Please send patient's prescription of Compound Estradiol cream 1 gram vaginally twice a week for the year.

## 2019-11-30 NOTE — Telephone Encounter (Signed)
Rx called in 

## 2019-12-02 LAB — PAP IG W/ RFLX HPV ASCU

## 2019-12-02 LAB — HUMAN PAPILLOMAVIRUS, HIGH RISK: HPV DNA High Risk: NOT DETECTED

## 2019-12-08 ENCOUNTER — Ambulatory Visit: Payer: 59 | Admitting: Dermatology

## 2019-12-28 ENCOUNTER — Ambulatory Visit: Payer: 59 | Admitting: Physician Assistant

## 2019-12-30 ENCOUNTER — Ambulatory Visit: Payer: 59

## 2020-02-01 ENCOUNTER — Ambulatory Visit: Payer: 59

## 2020-02-25 ENCOUNTER — Other Ambulatory Visit: Payer: Self-pay

## 2020-02-25 ENCOUNTER — Ambulatory Visit (INDEPENDENT_AMBULATORY_CARE_PROVIDER_SITE_OTHER): Payer: 59 | Admitting: Physician Assistant

## 2020-02-25 ENCOUNTER — Encounter: Payer: Self-pay | Admitting: Physician Assistant

## 2020-02-25 DIAGNOSIS — L821 Other seborrheic keratosis: Secondary | ICD-10-CM

## 2020-02-25 DIAGNOSIS — Z85828 Personal history of other malignant neoplasm of skin: Secondary | ICD-10-CM | POA: Diagnosis not present

## 2020-02-25 DIAGNOSIS — L820 Inflamed seborrheic keratosis: Secondary | ICD-10-CM | POA: Diagnosis not present

## 2020-02-25 DIAGNOSIS — Z1283 Encounter for screening for malignant neoplasm of skin: Secondary | ICD-10-CM

## 2020-03-11 ENCOUNTER — Other Ambulatory Visit: Payer: Self-pay

## 2020-03-11 ENCOUNTER — Ambulatory Visit
Admission: RE | Admit: 2020-03-11 | Discharge: 2020-03-11 | Disposition: A | Discharge status: Home / Self Care | Payer: 59 | Source: Ambulatory Visit | Attending: Obstetrics & Gynecology | Admitting: Obstetrics & Gynecology

## 2020-03-11 DIAGNOSIS — Z1231 Encounter for screening mammogram for malignant neoplasm of breast: Secondary | ICD-10-CM

## 2020-03-14 ENCOUNTER — Encounter: Payer: Self-pay | Admitting: Physician Assistant

## 2020-03-14 NOTE — Progress Notes (Signed)
   Follow-Up Visit   Subjective  Kelli Moore is a 60 y.o. female who presents for the following: Annual Exam (Right thigh recheck mostly gone with otc lotion).   The following portions of the chart were reviewed this encounter and updated as appropriate:  Tobacco  Allergies  Meds  Problems  Med Hx  Surg Hx  Fam Hx      Objective  Well appearing patient in no apparent distress; mood and affect are within normal limits.  A full examination was performed including scalp, head, eyes, ears, nose, lips, neck, chest, axillae, abdomen, back, buttocks, bilateral upper extremities, bilateral lower extremities, hands, feet, fingers, toes, fingernails, and toenails. All findings within normal limits unless otherwise noted below.  Objective  Right Breast: White scar- clear  Objective  Left Breast: Full body skin examination  Objective  Left Lower Back: Multiple Stuck-on, waxy, tan-brown papules and plaques. --Discussed benign etiology and prognosis.   Objective  Anterior Mid Neck, Left Ankle - Anterior, Left Buccal Cheek, Left Dorsal Hand (6), Left Forearm - Posterior (5), Left Lower Leg - Anterior (2), Left Parotid Area, Left Thigh - Anterior (4), Left Thigh - Posterior (6), Left Upper Arm - Posterior (4), Right Anterior Neck, Right Buccal Cheek, Right Dorsal Hand (7), Right Forearm - Posterior (2), Right Lower Leg - Anterior (3), Right Parotid Area, Right Shoulder - Posterior, Right Thigh - Anterior (2), Right Thigh - Posterior (6): Erythematous stuck-on, waxy papule or plaque.    Assessment & Plan  History of basal cell carcinoma (BCC) Right Breast  Yearly skin check  Encounter for screening for malignant neoplasm of skin Left Breast  Yearly skin check  Seborrheic keratosis Left Lower Back  Okay to leave if stable  Seborrheic keratosis, inflamed (55) Right Shoulder - Posterior; Left Ankle - Anterior; Left Upper Arm - Posterior (4); Left Forearm - Posterior (5); Right  Forearm - Posterior (2); Left Thigh - Anterior (4); Right Thigh - Anterior (2); Left Thigh - Posterior (6); Right Thigh - Posterior (6); Left Lower Leg - Anterior (2); Right Lower Leg - Anterior (3); Left Dorsal Hand (6); Right Dorsal Hand (7); Left Parotid Area; Right Parotid Area; Left Buccal Cheek; Right Buccal Cheek; Right Anterior Neck; Anterior Mid Neck  Destruction of lesion - Anterior Mid Neck, Left Ankle - Anterior, Left Buccal Cheek, Left Dorsal Hand, Left Forearm - Posterior, Left Lower Leg - Anterior (2), Left Parotid Area, Left Thigh - Anterior (4), Left Thigh - Posterior, Left Upper Arm - Posterior, Right Anterior Neck, Right Buccal Cheek, Right Dorsal Hand (2), Right Forearm - Posterior (2), Right Lower Leg - Anterior (3), Right Parotid Area, Right Shoulder - Posterior, Right Thigh - Anterior (2), Right Thigh - Posterior Complexity: simple   Destruction method: cryotherapy   Informed consent: discussed and consent obtained   Timeout:  patient name, date of birth, surgical site, and procedure verified Lesion destroyed using liquid nitrogen: Yes   Cryotherapy cycles:  3 Outcome: patient tolerated procedure well with no complications      I, Marla Pouliot, PA-C, have reviewed all documentation's for this visit.  The documentation on 03/14/20 for the exam, diagnosis, procedures and orders are all accurate and complete.

## 2020-07-26 ENCOUNTER — Other Ambulatory Visit: Payer: Self-pay

## 2020-07-26 NOTE — Telephone Encounter (Signed)
Dr. Dellis Filbert wrote "Can send for Estrace cream or find another pharmacy which still does the compound Estradiol cream."  [facility] said the Estradiol 0.01% cream is  more cost effective for patients now than the compound. No longer making it.  Please advise how you want her to use the generic Estrace vaginal cream.

## 2020-07-26 NOTE — Telephone Encounter (Signed)
Note from pharmacy regarding compound Estradiol Vag Cm 0.02% cream.  "We no longer compound this. Please send over new RX for estradiol 0.01% commercial product. Thank you."

## 2020-07-29 MED ORDER — ESTRADIOL 0.1 MG/GM VA CREA: TOPICAL_CREAM | VAGINAL | 3 refills | Status: DC

## 2020-08-01 NOTE — Telephone Encounter (Signed)
Dr. Dellis Filbert - You sent Rx with directions for "one applicatorful vaginally twice weekly" An applicatorful is 4 grams. Is that what you want patient to use twice weekly?

## 2020-08-02 MED ORDER — ESTRADIOL 0.1 MG/GM VA CREA: TOPICAL_CREAM | VAGINAL | 2 refills | Status: DC

## 2020-08-02 NOTE — Telephone Encounter (Signed)
Spoke with patient and advised to use just one gram hs twice weekly. She voiced understanding.  Rx was corrected and resent to [facility] for future fills.

## 2020-08-02 NOTE — Addendum Note (Signed)
Addended by: Ramond Craver on: 08/02/2020 09:46 AM   Modules accepted: Orders

## 2020-11-28 ENCOUNTER — Other Ambulatory Visit: Payer: Self-pay | Admitting: Obstetrics & Gynecology

## 2020-11-28 DIAGNOSIS — Z1231 Encounter for screening mammogram for malignant neoplasm of breast: Secondary | ICD-10-CM

## 2020-11-29 ENCOUNTER — Ambulatory Visit: Payer: 59 | Admitting: Obstetrics & Gynecology

## 2020-12-01 ENCOUNTER — Other Ambulatory Visit: Payer: Self-pay | Admitting: Obstetrics & Gynecology

## 2020-12-01 NOTE — Telephone Encounter (Signed)
Patient has new patient appointment with Dr. Sabra Heck on 12/08/20 Last annual exam with you on 11/2019

## 2020-12-08 ENCOUNTER — Ambulatory Visit (HOSPITAL_BASED_OUTPATIENT_CLINIC_OR_DEPARTMENT_OTHER): Payer: 59 | Admitting: Obstetrics & Gynecology

## 2020-12-25 ENCOUNTER — Other Ambulatory Visit: Payer: Self-pay | Admitting: Obstetrics & Gynecology

## 2020-12-28 ENCOUNTER — Other Ambulatory Visit: Payer: Self-pay | Admitting: Obstetrics & Gynecology

## 2020-12-28 NOTE — Telephone Encounter (Signed)
Last AEX 12/13/19.  She has New Patient appt scheduled with Dr. Hale Bogus on 01/02/21.  Dr. Marguerita Merles refilled on 12/01/20 for #30 so patient might come up a day or so short of her appt with Dr. Sabra Heck.

## 2021-01-02 ENCOUNTER — Encounter (HOSPITAL_BASED_OUTPATIENT_CLINIC_OR_DEPARTMENT_OTHER): Payer: Self-pay | Admitting: Obstetrics & Gynecology

## 2021-01-02 ENCOUNTER — Ambulatory Visit (INDEPENDENT_AMBULATORY_CARE_PROVIDER_SITE_OTHER): Payer: 59 | Admitting: Obstetrics & Gynecology

## 2021-01-02 ENCOUNTER — Other Ambulatory Visit (HOSPITAL_COMMUNITY)
Admission: RE | Admit: 2021-01-02 | Discharge: 2021-01-02 | Disposition: A | Discharge status: Home / Self Care | Payer: 59 | Source: Ambulatory Visit | Attending: Obstetrics & Gynecology | Admitting: Obstetrics & Gynecology

## 2021-01-02 ENCOUNTER — Other Ambulatory Visit: Payer: Self-pay

## 2021-01-02 VITALS — BP 140/82 | HR 82 | Ht 63.5 in | Wt 156.6 lb

## 2021-01-02 DIAGNOSIS — Z124 Encounter for screening for malignant neoplasm of cervix: Secondary | ICD-10-CM

## 2021-01-02 DIAGNOSIS — Z1159 Encounter for screening for other viral diseases: Secondary | ICD-10-CM

## 2021-01-02 DIAGNOSIS — N393 Stress incontinence (female) (male): Secondary | ICD-10-CM

## 2021-01-02 DIAGNOSIS — Z79890 Hormone replacement therapy (postmenopausal): Secondary | ICD-10-CM

## 2021-01-02 DIAGNOSIS — Z01419 Encounter for gynecological examination (general) (routine) without abnormal findings: Secondary | ICD-10-CM | POA: Diagnosis not present

## 2021-01-02 DIAGNOSIS — Z780 Asymptomatic menopausal state: Secondary | ICD-10-CM

## 2021-01-02 DIAGNOSIS — F5104 Psychophysiologic insomnia: Secondary | ICD-10-CM | POA: Diagnosis not present

## 2021-01-02 DIAGNOSIS — Z5320 Procedure and treatment not carried out because of patient's decision for unspecified reasons: Secondary | ICD-10-CM

## 2021-01-02 DIAGNOSIS — Z800 Family history of malignant neoplasm of digestive organs: Secondary | ICD-10-CM | POA: Diagnosis not present

## 2021-01-02 DIAGNOSIS — F3289 Other specified depressive episodes: Secondary | ICD-10-CM

## 2021-01-02 DIAGNOSIS — Z114 Encounter for screening for human immunodeficiency virus [HIV]: Secondary | ICD-10-CM

## 2021-01-02 MED ORDER — FLUOXETINE HCL 20 MG PO CAPS: 20.0000 mg | ORAL_CAPSULE | Freq: Every day | ORAL | 3 refills | Status: DC

## 2021-01-02 MED ORDER — FLUOXETINE HCL 40 MG PO CAPS: 40.0000 mg | ORAL_CAPSULE | Freq: Every day | ORAL | 4 refills | Status: DC

## 2021-01-02 MED ORDER — ESTRADIOL 0.1 MG/GM VA CREA: TOPICAL_CREAM | VAGINAL | 3 refills | Status: DC

## 2021-01-02 NOTE — Progress Notes (Signed)
60 y.o. G0P0000 Married White or Caucasian female here for annual exam.  Denies vaginal bleeding. Never on HRT.  Uses estradiol cream and this helps. Has several questions she wants to discussed.  Concerned that her prozac dosage should be increased.  H/O depression and stressors.  Also has hx of more significant PMS.  Although has not had a cycle in about 10 years, has some symptoms of mood changes that were like when she had menstrual cycles.  Has noticed some worsening issues with urinary leakage.  This primarily occurs with cough, laugh, sneeze, and lifting.  Types of incontinence discussed as well as treatment options.  Urogyn referral for possible surgery and pelvic PT discussed.  Pt would like PT referral.  Has hx of chronic insomnia.  Takes ambien 10mg  tab, 1/2 nightly, with two unisom and still only sleeps about 6 hours.  Wants to know my opinion of this.  Feel neurology evaluation could be helpful.  Would be good if she could use less ambien or less frequently as long term side effects are not well known.  Pt does report it makes her nervous at times to think about being on these forevery.  Patient's last menstrual period was 09/14/2010.          Sexually active: Yes.    The current method of family planning is post menopausal status.    Exercising: yes, gym and yoga Smoker:  no  Health Maintenance: Pap:  11/27/19 ASCUS with neg HR HPV History of abnormal Pap:  no MMG:  03/11/20 neg Colonoscopy:  scheduled in January BMD:   12/03/16, normal.  Plan to repeat 02/2022 with MMG Screening Labs:  PCPs discussed   reports that she has never smoked. She has never used smokeless tobacco. She reports current alcohol use. She reports that she does not use drugs.  Past Medical History:  Diagnosis Date   Anxiety    Depression    GERD (gastroesophageal reflux disease)    Mass of right breast 02/2017    Past Surgical History:  Procedure Laterality Date   ABDOMINAL SURGERY  2020   LIPOSUCTION    BREAST BIOPSY Right    BREAST LUMPECTOMY WITH RADIOACTIVE SEED LOCALIZATION Right 03/15/2017   Procedure: RIGHT BREAST LUMPECTOMY WITH RADIOACTIVE SEED LOCALIZATION ERAS PATHWAY;  Surgeon: Jovita Kussmaul, MD;  Location: [facility];  Service: General;  Laterality: Right;   BREAST SURGERY  02/2017   RIGHT BREAST LUMPECTOMY   BUNIONECTOMY Right 2016   CARPAL TUNNEL RELEASE Right 11/16/2014   Procedure: CARPAL TUNNEL RELEASE;  Surgeon: Daryll Brod, MD;  Location: [facility];  Service: Orthopedics;  Laterality: Right;   DIAGNOSTIC LAPAROSCOPY     METATARSAL OSTEOTOMY WITH BUNIONECTOMY Left 01/29/2017   TRIGGER FINGER RELEASE Right 11/16/2014   Procedure: RELEASE TRIGGER FINGER/A-1 PULLEY;  Surgeon: Daryll Brod, MD;  Location: [facility];  Service: Orthopedics;  Laterality: Right;    Current Outpatient Medications  Medication Sig Dispense Refill   doxylamine, Sleep, (UNISOM) 25 MG tablet Take 25 mg by mouth at bedtime as needed.     estradiol (ESTRACE VAGINAL) 0.1 MG/GM vaginal cream Insert 1 gram (1/4 appful) intravaginally twice weekly. 42.5 g 2   FLUoxetine (PROZAC) 20 MG capsule TAKE 1 CAPSULE BY MOUTH DAILY. 30 capsule 0   fluticasone (FLONASE) 50 MCG/ACT nasal spray Place into the nose.     Multiple Vitamin (MULTIVITAMIN) tablet Take 1 tablet by mouth daily.     Probiotic Product (PROBIOTIC  ADVANCED PO) Take by mouth.     zolpidem (AMBIEN) 5 MG tablet Take 1 tablet by mouth at bedtime as needed.     Current Facility-Administered Medications  Medication Dose Route Frequency Provider Last Rate Last Admin   triamcinolone acetonide (KENALOG) 10 MG/ML injection 10 mg  10 mg Other Once Trula Slade, DPM        Family History  Problem Relation Age of Onset   Stroke Maternal Grandfather    Hypertension Father    Cancer Mother        colon   Cancer Maternal Aunt        ovarian   Cancer Maternal Uncle        liver   Cancer Maternal Uncle         liver   Breast cancer Neg Hx     Review of Systems  All other systems reviewed and are negative.  Exam:   BP 140/82   Pulse 82   Ht 5' 3.5" (1.613 m)   Wt 156 lb 9.6 oz (71 kg)   LMP 09/14/2010   BMI 27.31 kg/m   Height: 5' 3.5" (161.3 cm)  General appearance: alert, cooperative and appears stated age Head: Normocephalic, without obvious abnormality, atraumatic Neck: no adenopathy, supple, symmetrical, trachea midline and thyroid normal to inspection and palpation Lungs: clear to auscultation bilaterally Breasts: normal appearance, no masses or tenderness Heart: regular rate and rhythm Abdomen: soft, non-tender; bowel sounds normal; no masses,  no organomegaly Extremities: extremities normal, atraumatic, no cyanosis or edema Skin: Skin color, texture, turgor normal. No rashes or lesions Lymph nodes: Cervical, supraclavicular, and axillary nodes normal. No abnormal inguinal nodes palpated Neurologic: Grossly normal   Pelvic: External genitalia:  no lesions              Urethra:  normal appearing urethra with no masses, tenderness or lesions              Bartholins and Skenes: normal                 Vagina: normal appearing vagina with normal color and no discharge, no lesions              Cervix: no lesions              Pap taken: Yes.   Bimanual Exam:  Uterus:  normal size, contour, position, consistency, mobility, non-tender              Adnexa: normal adnexa and no mass, fullness, tenderness               Rectovaginal: Confirms               Anus:  normal sphincter tone, no lesions  Chaperone, Ezekiel Ina, RN, was present for exam.  Assessment/Plan: 1. Well woman exam with routine gynecological exam - pap obtained today due to ASCUS/epithelial abnormality last year.  HR HPV negative 2021.  Not ordered today. - MMG normal 02/2020 - colonoscopy scheduled for January with Dr. Collene Mares - Plan BMD 02/2022 with MMG - vaccines reviewed/updated - lab work has been done  with Dr. Melford Aase.    2. Family history of colon cancer  3. Postmenopausal - estradiol (ESTRACE VAGINAL) 0.1 MG/GM vaginal cream; Insert 1 gram intravaginally twice weekly.  Dispense: 42.5 g; Refill: 3  4. Chronic insomnia - Ambulatory referral to Neurology  5. Other depression - will increase dosage today - FLUoxetine (PROZAC) 40 MG capsule; Take 1 capsule (  40 mg total) by mouth daily.  Dispense: 90 capsule; Refill: 4  6. Encounter for hepatitis C screening test for low risk patient - Hepatitis C antibody  7. Encounter for screening for HIV - HIV Antibody (routine testing w rflx)  8. Stress incontinence - Ambulatory referral to Physical Therapy

## 2021-01-03 LAB — HIV ANTIBODY (ROUTINE TESTING W REFLEX): HIV Screen 4th Generation wRfx: NONREACTIVE

## 2021-01-03 LAB — HEPATITIS C ANTIBODY: Hep C Virus Ab: <0.1 s/co ratio (ref 0.0–0.9)

## 2021-01-04 LAB — CYTOLOGY - PAP
Adequacy: ABSENT
Diagnosis: NEGATIVE

## 2021-01-18 ENCOUNTER — Ambulatory Visit (INDEPENDENT_AMBULATORY_CARE_PROVIDER_SITE_OTHER): Payer: 59 | Admitting: Nurse Practitioner

## 2021-01-18 ENCOUNTER — Other Ambulatory Visit: Payer: Self-pay

## 2021-01-18 ENCOUNTER — Encounter (HOSPITAL_BASED_OUTPATIENT_CLINIC_OR_DEPARTMENT_OTHER): Payer: Self-pay | Admitting: Nurse Practitioner

## 2021-01-18 VITALS — BP 128/82 | HR 84 | Ht 64.0 in | Wt 157.0 lb

## 2021-01-18 DIAGNOSIS — J301 Allergic rhinitis due to pollen: Secondary | ICD-10-CM | POA: Diagnosis not present

## 2021-01-18 DIAGNOSIS — F32A Depression, unspecified: Secondary | ICD-10-CM

## 2021-01-18 DIAGNOSIS — Z1329 Encounter for screening for other suspected endocrine disorder: Secondary | ICD-10-CM

## 2021-01-18 DIAGNOSIS — Z130 Encounter for screening for diseases of the blood and blood-forming organs and certain disorders involving the immune mechanism: Secondary | ICD-10-CM

## 2021-01-18 DIAGNOSIS — J358 Other chronic diseases of tonsils and adenoids: Secondary | ICD-10-CM

## 2021-01-18 DIAGNOSIS — Z0000 Encounter for general adult medical examination without abnormal findings: Secondary | ICD-10-CM

## 2021-01-18 DIAGNOSIS — Z13228 Encounter for screening for other metabolic disorders: Secondary | ICD-10-CM

## 2021-01-18 DIAGNOSIS — Z1321 Encounter for screening for nutritional disorder: Secondary | ICD-10-CM

## 2021-01-18 MED ORDER — AZELASTINE HCL 0.1 % NA SOLN: 2.0000 | Freq: Two times a day (BID) | NASAL | 12 refills | Status: AC

## 2021-01-18 NOTE — Progress Notes (Signed)
Orma Render, DNP, AGNP-c Primary Care & Sports Medicine [address]   [place], [place] [address] [phone]  New patient visit   Patient: Kelli Moore   DOB: 10/17/1960   60 y.o. Female  MRN: [id number] Visit Date: 01/18/2021  Patient Care Team: Tayten Heber, Coralee Pesa, NP as PCP - General (Nurse Practitioner) Megan Salon, MD as Consulting Physician (Obstetrics and Gynecology)  Today's healthcare provider: Orma Render, NP   Chief Complaint  Patient presents with   New Patient (Initial Visit)    Patient presents today to establish care, referral from Dr. Sabra Heck. Patient stated she would like a complete lab panel. Other issue she would like to discuss is tonsil stones. She did inquire a RF on Azelastine 0.1% nasal spray she uses occassionally.   Subjective    HPI HPI     New Patient (Initial Visit)    Additional comments: Patient presents today to establish care, referral from Dr. Sabra Heck. Patient stated she would like a complete lab panel. Other issue she would like to discuss is tonsil stones. She did inquire a RF on Azelastine 0.1% nasal spray she uses occassionally.      Last edited by Pennelope Bracken on 01/18/2021  9:23 AM.      Kelli Moore is a 60 y.o. female who presents today as a new patient to establish care.    Lorell has a PMH of depression, anxiety, and chronic insomnia.   Depression and Anxiety Taking prozac 40mg /d Recently increased from 20mg  by GYN due to worsening symptoms, however, she has not started this new dose.  She denies side effects from medication She is taking daily  Chronic Insomnia Taking ambien 5mg  and doxylamine succinate (unisom) 50mg  at bedtime Only getting about 6 hours of sleep with medications.  She reports when she does not take the [place] she is unable to fall asleep.  She is taking nightly. Recent referral to neurology for evaluation by GYN She has not been contacted by neurology yet to set up  her appointment She denies sleepiness during the day, restlessness, forgetfulness, snoring.  She has tried other treatment methods for sleep. She has been taking the [place] for 7-8 years on and off She first noticed difficulty sleeping around peri-menopause and it has not improved.   She does not feel this is related to her depression/anxiety symptoms, however, she does have racing thoughts that keep her awake.   She endorses chronic tonsilliths present.  She does not have recurrent tonsillitis.  She tells me that she will try to remove these and often causes her tonsils to bleed.  She would like to know what she can do to prevent these.   She is scheduled for mammogram 02/2021 and colonoscopy 01/28/2021. She has an annual eye exam.   She is married. 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  She reports her diet is fairly good. She admits to not eating enough vegetables. She tries to follow healthy options.  She is active in the gym 2-3 days a week and doing yoga 1 day a week.  She is a non-smoker, does not use recreational drugs, and drinks alcohol on rare, social occasions.   Past Medical History:  Diagnosis Date   Anxiety    Depression    GERD (gastroesophageal reflux disease)    Mass of right breast 02/2017   Past Surgical History:  Procedure Laterality Date   ABDOMINAL SURGERY  2020   LIPOSUCTION   BREAST BIOPSY Right    BREAST LUMPECTOMY WITH RADIOACTIVE SEED LOCALIZATION Right 03/15/2017   Procedure: RIGHT BREAST LUMPECTOMY WITH RADIOACTIVE SEED LOCALIZATION ERAS PATHWAY;  Surgeon: Jovita Kussmaul, MD;  Location: [facility];  Service: General;  Laterality: Right;   BUNIONECTOMY Right 2016   CARPAL TUNNEL RELEASE Right 11/16/2014   Procedure: CARPAL TUNNEL RELEASE;  Surgeon: Daryll Brod, MD;  Location: [facility];  Service: Orthopedics;  Laterality: Right;   DIAGNOSTIC LAPAROSCOPY     for pregnancy   METATARSAL  OSTEOTOMY WITH BUNIONECTOMY Left 01/29/2017   TRIGGER FINGER RELEASE Right 11/16/2014   Procedure: RELEASE TRIGGER FINGER/A-1 PULLEY;  Surgeon: Daryll Brod, MD;  Location: [facility];  Service: Orthopedics;  Laterality: Right;   Family Status  Relation Name Status   MGF  Deceased   Father  Alive   Mother  Deceased   Mat Aunt  Deceased   Mat Uncle  Deceased   Mat Uncle  Deceased   Neg Hx  (Not Specified)   Family History  Problem Relation Age of Onset   Stroke Maternal Grandfather    Hypertension Father    Cancer Mother        colon   Cancer Maternal Aunt        ovarian   Cancer Maternal Uncle        liver   Cancer Maternal Uncle        liver   Breast cancer Neg Hx    Social History   Socioeconomic History   Marital status: Married    Spouse name: Not on file   Number of children: Not on file   Years of education: Not on file   Highest education level: Not on file  Occupational History   Not on file  Tobacco Use   Smoking status: Never   Smokeless tobacco: Never  Vaping Use   Vaping Use: Never used  Substance and Sexual Activity   Alcohol use: Yes    Comment: occasionally   Drug use: No   Sexual activity: Yes    Partners: Male    Birth control/protection: Post-menopausal    Comment: 1st intercourse- 22, partners- 37, married- 18.5 yrs   Other Topics Concern   Not on file  Social History Narrative   Not on file   Social Determinants of Health   Financial Resource Strain: Not on file  Food Insecurity: Not on file  Transportation Needs: Not on file  Physical Activity: Not on file  Stress: Not on file  Social Connections: Not on file   Outpatient Medications Prior to Visit  Medication Sig   doxylamine, Sleep, (UNISOM) 25 MG tablet Take 25 mg by mouth at bedtime as needed.   estradiol (ESTRACE VAGINAL) 0.1 MG/GM vaginal cream Insert 1 gram intravaginally twice weekly.   FLUoxetine (PROZAC) 40 MG capsule Take 1 capsule (40 mg total) by mouth  daily.   fluticasone (FLONASE) 50 MCG/ACT nasal spray Place into the nose.   Multiple Vitamin (MULTIVITAMIN) tablet Take 1 tablet by mouth daily.   Probiotic Product (PROBIOTIC ADVANCED PO) Take by mouth.   zolpidem (AMBIEN) 5 MG tablet Take 1 tablet by mouth at bedtime as needed.   Facility-Administered Medications Prior to Visit  Medication Dose Route Frequency Provider   triamcinolone acetonide (KENALOG) 10 MG/ML injection 10 mg  10 mg Other Once Trula Slade, DPM   No Known Allergies  Immunization History  Administered Date(s) Administered  Influenza, Seasonal, Injecte, Preservative Fre 10/22/2017   Influenza,inj,Quad PF,6+ Mos 10/28/2015, 11/19/2016, 11/20/2017, 11/27/2019   Influenza-Unspecified 11/07/2020   PFIZER(Purple Top)SARS-COV-2 Vaccination 04/13/2019, 05/06/2019, 12/21/2019   Tdap 06/22/2015   Zoster Recombinat (Shingrix) 01/08/2019, 03/17/2019    Health Maintenance  Topic Date Due   Pneumococcal Vaccine 50-48 Years old (1 - PCV) Never done   COLONOSCOPY (Pts 45-69yrs Insurance coverage will need to be confirmed)  Never done   COVID-19 Vaccine (4 - Booster for [employer] series) 01/18/2021 (Originally 02/15/2020)   MAMMOGRAM  03/11/2022   PAP SMEAR-Modifier  01/03/2024   TETANUS/TDAP  06/21/2025   INFLUENZA VACCINE  Completed   Hepatitis C Screening  Completed   HIV Screening  Completed   Zoster Vaccines- Shingrix  Completed   HPV VACCINES  Aged Out    Patient Care Team: Catheleen Langhorne, Coralee Pesa, NP as PCP - General (Nurse Practitioner) Megan Salon, MD as Consulting Physician (Obstetrics and Gynecology)  Review of Systems All review of systems negative except what is listed in the HPI   Objective    BP 128/82    Pulse 84    Ht 5\' 4"  (1.626 m)    Wt 157 lb (71.2 kg)    LMP 09/14/2010    SpO2 97%    BMI 26.95 kg/m  Physical Exam Vitals and nursing note reviewed.  Constitutional:      General: She is not in acute distress.    Appearance: Normal appearance.   Eyes:     Extraocular Movements: Extraocular movements intact.     Conjunctiva/sclera: Conjunctivae normal.     Pupils: Pupils are equal, round, and reactive to light.  Neck:     Vascular: No carotid bruit.  Cardiovascular:     Rate and Rhythm: Normal rate and regular rhythm.     Pulses: Normal pulses.     Heart sounds: Normal heart sounds. No murmur heard. Pulmonary:     Effort: Pulmonary effort is normal.     Breath sounds: Normal breath sounds. No wheezing.  Abdominal:     General: Abdomen is flat. Bowel sounds are normal.     Palpations: Abdomen is soft.     Tenderness: There is no right CVA tenderness or left CVA tenderness.  Musculoskeletal:        General: Normal range of motion.     Cervical back: Normal range of motion.     Right lower leg: No edema.     Left lower leg: No edema.  Skin:    General: Skin is warm and dry.     Capillary Refill: Capillary refill takes less than 2 seconds.  Neurological:     General: No focal deficit present.     Mental Status: She is alert and oriented to person, place, and time.  Psychiatric:        Mood and Affect: Mood normal.        Behavior: Behavior normal.        Thought Content: Thought content normal.        Judgment: Judgment normal.    Depression Screen PHQ 2/9 Scores 01/18/2021 01/02/2021  PHQ - 2 Score 0 0  PHQ- 9 Score 3 -   No results found for any visits on 01/18/21.  Assessment & Plan      Problem List Items Addressed This Visit     Screening for endocrine, nutritional, metabolic and immunity disorder   Relevant Orders   CBC with Differential/Platelet   Comprehensive metabolic panel   Lipid  panel   Hemoglobin A1c   TSH   VITAMIN D 25 Hydroxy (Vit-D Deficiency, Fractures)   Depression - Primary   Other Visit Diagnoses     Encounter for medical examination to establish care       Relevant Orders   CBC with Differential/Platelet   Comprehensive metabolic panel   Lipid panel   Hemoglobin A1c   TSH    VITAMIN D 25 Hydroxy (Vit-D Deficiency, Fractures)   Seasonal allergic rhinitis due to pollen       Relevant Medications   azelastine (ASTELIN) 0.1 % nasal spray        Return in about 1 year (around 01/18/2022) for CPE with labs.      Wilburta Milbourn, Coralee Pesa, NP, DNP, AGNP-C Primary Care & Sports Medicine at [facility]

## 2021-01-18 NOTE — Assessment & Plan Note (Signed)
Recurrent tonsilliths reported- non seen on exam today.  Suspect this is due to chronic rhinitis/post nasal drip.  Tonsils small, non erythematous. Multiple crypts present on evaluation of tonsil surface. Discussed relatively benign nature of tonsilliths.  Recommendation to gargle in warm salt water daily to help remove additional debris from crypts. May also use antiseptic mouthwash at alternate time of day for additional flushing and antibacterial properties.  Avoid trying to remove stones from the tonsils. If stones are bothersome, may gently rake surface with cotton tip swab, if stone does not removal easily, try gargle in warm salt water first to help loosen.  If symptoms of infection present, she will let us know.

## 2021-01-18 NOTE — Assessment & Plan Note (Signed)
Stable- refill on azelastine  today

## 2021-01-18 NOTE — Assessment & Plan Note (Signed)
Appears to be doing well  Recent exacerbation with increased dose of Prozac by GYN.  Recommend double 20mg  dose to complete current prescription then start new 40mg  prescription.  Patient will contact the office if she has any adverse reactions with increased dose or wishes to taper back to 20mg .

## 2021-01-18 NOTE — Patient Instructions (Signed)
Thank you for choosing [place] at [facility] for your Primary Care needs. I am excited for the opportunity to partner with you to meet your health care goals. It was a pleasure meeting you today!  Recommendations from today's visit: We will let you know if there are any concerning findings on your lab work If all is well, ,we will plan to follow-up with you in a year! Happy New Year!  Information on diet, exercise, and health maintenance recommendations are listed below. This is information to help you be sure you are on track for optimal health and monitoring.   Please look over this and let us know if you have any questions or if you have completed any of the health maintenance outside of [place] so that we can be sure your records are up to date.  ___________________________________________________________ About Me: I am an Adult-Geriatric Nurse Practitioner with a background in caring for patients for more than 20 years with a strong intensive care background. I provide primary care and sports medicine services to patients age 58 and older within this office. My education had a strong focus on caring for the older adult population, which I am passionate about. I am also the director of the APP Fellowship with [facility].   My desire is to provide you with the best service through preventive medicine and supportive care. I consider you a part of the medical team and value your input. I work diligently to ensure that you are heard and your needs are met in a safe and effective manner. I want you to feel comfortable with me as your provider and want you to know that your health concerns are important to me.  For your information, our office hours are: Monday, Tuesday, and Thursday 8:00 AM - 5:00 PM Wednesday and Friday 8:00 AM - 12:00 PM.   In my time away from the office I am teaching new APP's within the system and am unavailable, but my partner, Dr. Burnard Bunting is in  the office for emergent needs.   If you have questions or concerns, please call our office at [phone] or send [place] a MyChart message and we will respond as quickly as possible.  ____________________________________________________________ MyChart:  For all urgent or time sensitive needs we ask that you please call the office to avoid delays. Our number is [phone]. MyChart is not constantly monitored and due to the large volume of messages a day, replies may take up to 72 business hours.  MyChart Policy: MyChart allows for you to see your visit notes, after visit summary, provider recommendations, lab and tests results, make an appointment, request refills, and contact your provider or the office for non-urgent questions or concerns. Providers are seeing patients during normal business hours and do not have built in time to review MyChart messages.  We ask that you allow a minimum of 3 business days for responses to [employer]. For this reason, please do not send urgent requests through [place]. Please call the office at [phone]. New and ongoing conditions may require a visit. We have virtual and in person visit available for your convenience.  Complex MyChart concerns may require a visit. Your provider may request you schedule a virtual or in person visit to ensure we are providing the best care possible. MyChart messages sent after 11:00 AM on Friday will not be received by the provider until Monday morning.    Lab and Test Results: You will receive your lab  and test results on MyChart as soon as they are completed and results have been sent by the lab or testing facility. Due to this service, you will receive your results BEFORE your provider.  I review lab and tests results each morning prior to seeing patients. Some results require collaboration with other providers to ensure you are receiving the most appropriate care. For this reason, we ask that you please allow a minimum  of 3-5 business days from the time the ALL results have been received for your provider to receive and review lab and test results and contact you about these.  Most lab and test result comments from the provider will be sent through [place]. Your provider may recommend changes to the plan of care, follow-up visits, repeat testing, ask questions, or request an office visit to discuss these results. You may reply directly to this message or call the office at [phone] to provide information for the provider or set up an appointment. In some instances, you will be called with test results and recommendations. Please let us know if this is preferred and we will make note of this in your chart to provide this for you.    If you have not heard a response to your lab or test results in 5 business days from all results returning to [place], please call the office to let us know. We ask that you please avoid calling prior to this time unless there is an emergent concern. Due to high call volumes, this can delay the resulting process.  After Hours: For all non-emergency after hours needs, please call the office at [phone] and select the option to reach the on-call provider service. On-call services are shared between multiple [facility] offices and therefore it will not be possible to speak directly with your provider. On-call providers may provide medical advice and recommendations, but are unable to provide refills for maintenance medications.  For all emergency or urgent medical needs after normal business hours, we recommend that you seek care at the closest Urgent Care or Emergency Department to ensure appropriate treatment in a timely manner.  [facility] has a 24 hour emergency room located on the ground floor for your convenience.   Urgent Concerns During the Business Day Providers are seeing patients from 8AM to [place] with a busy schedule and are most often not able to  respond to non-urgent calls until the end of the day or the next business day. If you should have URGENT concerns during the day, please call and speak to the nurse or schedule a same day appointment so that we can address your concern without delay.   Thank you, again, for choosing me as your health care partner. I appreciate your trust and look forward to learning more about you.   Worthy Keeler, DNP, AGNP-c ___________________________________________________________  Health Maintenance Recommendations Screening Testing Mammogram Every 1 -2 years based on history and risk factors Starting at age 47 Pap Smear Ages 21-39 every 3 years Ages 35-65 every 5 years with HPV testing More frequent testing may be required based on results and history Colon Cancer Screening Every 1-10 years based on test performed, risk factors, and history Starting at age 84 Bone Density Screening Every 2-10 years based on history Starting at age 12 for women Recommendations for men differ based on medication usage, history, and risk factors AAA Screening One time ultrasound Men 43-69 years old who have every smoked Lung Cancer Screening Low Dose Lung CT  every 12 months Age 43-80 years with a 30 pack-year smoking history who still smoke or who have quit within the last 15 years  Screening Labs Routine  Labs: Complete Blood Count (CBC), Complete Metabolic Panel (CMP), Cholesterol (Lipid Panel) Every 6-12 months based on history and medications May be recommended more frequently based on current conditions or previous results Hemoglobin A1c Lab Every 3-12 months based on history and previous results Starting at age 48 or earlier with diagnosis of diabetes, high cholesterol, BMI >26, and/or risk factors Frequent monitoring for patients with diabetes to ensure blood sugar control Thyroid Panel (TSH w/ T3 & T4) Every 6 months based on history, symptoms, and risk factors May be repeated more often if on  medication HIV One time testing for all patients 35 and older May be repeated more frequently for patients with increased risk factors or exposure Hepatitis C One time testing for all patients 27 and older May be repeated more frequently for patients with increased risk factors or exposure Gonorrhea, Chlamydia Every 12 months for all sexually active persons 13-24 years Additional monitoring may be recommended for those who are considered high risk or who have symptoms PSA Men 59-70 years old with risk factors Additional screening may be recommended from age 59-69 based on risk factors, symptoms, and history  Vaccine Recommendations Tetanus Booster All adults every 10 years Flu Vaccine All patients 6 months and older every year COVID Vaccine All patients 12 years and older Initial dosing with booster May recommend additional booster based on age and health history HPV Vaccine 2 doses all patients age 34-26 Dosing may be considered for patients over 26 Shingles Vaccine (Shingrix) 2 doses all adults 52 years and older Pneumonia (Pneumovax 23) All adults 44 years and older May recommend earlier dosing based on health history Pneumonia (Prevnar 38) All adults 62 years and older Dosed 1 year after Pneumovax 23  Additional Screening, Testing, and Vaccinations may be recommended on an individualized basis based on family history, health history, risk factors, and/or exposure.  __________________________________________________________  Diet Recommendations for All Patients  I recommend that all patients maintain a diet low in saturated fats, carbohydrates, and cholesterol. While this can be challenging at first, it is not impossible and small changes can make big differences.  Things to try: Decreasing the amount of soda, sweet tea, and/or juice to one or less per day and replace with water While water is always the first choice, if you do not like water you may consider adding a  water additive without sugar to improve the taste other sugar free drinks Replace potatoes with a brightly colored vegetable at dinner Use healthy oils, such as canola oil or olive oil, instead of butter or hard margarine Limit your bread intake to two pieces or less a day Replace regular pasta with low carb pasta options Bake, broil, or grill foods instead of frying Monitor portion sizes  Eat smaller, more frequent meals throughout the day instead of large meals  An important thing to remember is, if you love foods that are not great for your health, you don't have to give them up completely. Instead, allow these foods to be a reward when you have done well. Allowing yourself to still have special treats every once in a while is a nice way to tell yourself thank you for working hard to keep yourself healthy.   Also remember that every day is a new day. If you have a bad day and "fall off  the wagon", you can still climb right back up and keep moving along on your journey!  We have resources available to help you!  Some websites that may be helpful include: www.http://carter.biz/  Www.VeryWellFit.com _____________________________________________________________  Activity Recommendations for All Patients  I recommend that all adults get at least 20 minutes of moderate physical activity that elevates your heart rate at least 5 days out of the week.  Some examples include: Walking or jogging at a pace that allows you to carry on a conversation Cycling (stationary bike or outdoors) Water aerobics Yoga Weight lifting Dancing If physical limitations prevent you from putting stress on your joints, exercise in a pool or seated in a chair are excellent options.  Do determine your MAXIMUM heart rate for activity: YOUR AGE - 220 = MAX HeartRate   Remember! Do not push yourself too hard.  Start slowly and build up your pace, speed, weight, time in exercise, etc.  Allow your body to rest between  exercise and get good sleep. You will need more water than normal when you are exerting yourself. Do not wait until you are thirsty to drink. Drink with a purpose of getting in at least 8, 8 ounce glasses of water a day plus more depending on how much you exercise and sweat.    If you begin to develop dizziness, chest pain, abdominal pain, jaw pain, shortness of breath, headache, vision changes, lightheadedness, or other concerning symptoms, stop the activity and allow your body to rest. If your symptoms are severe, seek emergency evaluation immediately. If your symptoms are concerning, but not severe, please let us know so that we can recommend further evaluation.

## 2021-01-19 LAB — COMPREHENSIVE METABOLIC PANEL
ALT: 13 IU/L (ref 0–32)
AST: 18 IU/L (ref 0–40)
Albumin/Globulin Ratio: 2 (ref 1.2–2.2)
Albumin: 4.7 g/dL (ref 3.8–4.9)
Alkaline Phosphatase: 93 IU/L (ref 44–121)
BUN/Creatinine Ratio: 14 (ref 9–23)
BUN: 12 mg/dL (ref 6–24)
Bilirubin Total: 0.3 mg/dL (ref 0.0–1.2)
CO2: 24 mmol/L (ref 20–29)
Calcium: 9.4 mg/dL (ref 8.7–10.2)
Chloride: 99 mmol/L (ref 96–106)
Creatinine, Ser: 0.84 mg/dL (ref 0.57–1.00)
Globulin, Total: 2.4 g/dL (ref 1.5–4.5)
Glucose: 91 mg/dL (ref 70–99)
Potassium: 4.7 mmol/L (ref 3.5–5.2)
Sodium: 141 mmol/L (ref 134–144)
Total Protein: 7.1 g/dL (ref 6.0–8.5)
eGFR: 80 mL/min/{1.73_m2} (ref >59)

## 2021-01-19 LAB — CBC WITH DIFFERENTIAL/PLATELET
Basophils Absolute: 0 10*3/uL (ref 0.0–0.2)
Basos: 1 %
EOS (ABSOLUTE): 0.2 10*3/uL (ref 0.0–0.4)
Eos: 4 %
Hematocrit: 39.3 % (ref 34.0–46.6)
Hemoglobin: 13.4 g/dL (ref 11.1–15.9)
Immature Grans (Abs): 0 10*3/uL (ref 0.0–0.1)
Immature Granulocytes: 0 %
Lymphocytes Absolute: 1.7 10*3/uL (ref 0.7–3.1)
Lymphs: 28 %
MCH: 31.5 pg (ref 26.6–33.0)
MCHC: 34.1 g/dL (ref 31.5–35.7)
MCV: 93 fL (ref 79–97)
Monocytes Absolute: 0.6 10*3/uL (ref 0.1–0.9)
Monocytes: 11 %
Neutrophils Absolute: 3.5 10*3/uL (ref 1.4–7.0)
Neutrophils: 56 %
Platelets: 273 10*3/uL (ref 150–450)
RBC: 4.25 x10E6/uL (ref 3.77–5.28)
RDW: 11.8 % (ref 11.7–15.4)
WBC: 6.1 10*3/uL (ref 3.4–10.8)

## 2021-01-19 LAB — LIPID PANEL
Chol/HDL Ratio: 3 ratio (ref 0.0–4.4)
Cholesterol, Total: 236 mg/dL — ABNORMAL HIGH (ref 100–199)
HDL: 79 mg/dL (ref >39)
LDL Chol Calc (NIH): 143 mg/dL — ABNORMAL HIGH (ref 0–99)
Triglycerides: 84 mg/dL (ref 0–149)
VLDL Cholesterol Cal: 14 mg/dL (ref 5–40)

## 2021-01-19 LAB — VITAMIN D 25 HYDROXY (VIT D DEFICIENCY, FRACTURES): Vit D, 25-Hydroxy: 36.5 ng/mL (ref 30.0–100.0)

## 2021-01-19 LAB — HEMOGLOBIN A1C
Est. average glucose Bld gHb Est-mCnc: 111 mg/dL
Hgb A1c MFr Bld: 5.5 % (ref 4.8–5.6)

## 2021-01-19 LAB — TSH: TSH: 0.897 u[IU]/mL (ref 0.450–4.500)

## 2021-01-31 ENCOUNTER — Other Ambulatory Visit: Payer: Self-pay

## 2021-01-31 ENCOUNTER — Encounter: Payer: Self-pay | Admitting: Physical Therapy

## 2021-01-31 ENCOUNTER — Ambulatory Visit: Payer: 59 | Attending: Obstetrics & Gynecology | Admitting: Physical Therapy

## 2021-01-31 DIAGNOSIS — R293 Abnormal posture: Secondary | ICD-10-CM | POA: Diagnosis not present

## 2021-01-31 DIAGNOSIS — R279 Unspecified lack of coordination: Secondary | ICD-10-CM | POA: Insufficient documentation

## 2021-01-31 DIAGNOSIS — M6281 Muscle weakness (generalized): Secondary | ICD-10-CM | POA: Diagnosis present

## 2021-01-31 DIAGNOSIS — N393 Stress incontinence (female) (male): Secondary | ICD-10-CM | POA: Insufficient documentation

## 2021-01-31 NOTE — Patient Instructions (Signed)
Access Code: [email or web address] URL: [email or web address] Date: 01/31/2021 Prepared by: Jari Favre  Exercises Supine Lower Trunk Rotation - 1 x daily - 7 x weekly - 1 sets - 10 reps - 5 sec hold Seated Side Stretch - 1 x daily - 7 x weekly - 3 sets - 10 reps Happy Baby with Pelvic Floor Lengthening - 1 x daily - 7 x weekly - 1 sets - 3 reps - 30 hold Seated Hamstring Stretch - 1 x daily - 7 x weekly - 1 sets - 3 reps - 30 sec hold

## 2021-01-31 NOTE — Therapy (Signed)
[place] @ [facility] [place], [place], [address] Phone: [phone]   Fax:  [phone]  Physical Therapy Evaluation  Patient Details  Name: Kelli Moore MRN: [id number] Date of Birth: 12/26/1960 Referring Provider (PT): Hale Bogus MD.   Encounter Date: 01/31/2021   PT End of Session - 01/31/21 1613     Visit Number 1    Number of Visits 13    Date for PT Re-Evaluation 04/25/21    Authorization Type Friday    PT Start Time 1534    PT Stop Time 9833    PT Time Calculation (min) 40 min    Activity Tolerance Patient tolerated treatment well    Behavior During Therapy [facility] for tasks assessed/performed             Past Medical History:  Diagnosis Date   Anxiety    Depression    GERD (gastroesophageal reflux disease)    Mass of right breast 02/2017    Past Surgical History:  Procedure Laterality Date   ABDOMINAL SURGERY  2020   LIPOSUCTION   BREAST BIOPSY Right    BREAST LUMPECTOMY WITH RADIOACTIVE SEED LOCALIZATION Right 03/15/2017   Procedure: RIGHT BREAST LUMPECTOMY WITH RADIOACTIVE SEED [place];  Surgeon: Jovita Kussmaul, MD;  Location: [place];  Service: General;  Laterality: Right;   BUNIONECTOMY Right 2016   CARPAL TUNNEL RELEASE Right 11/16/2014   Procedure: CARPAL TUNNEL RELEASE;  Surgeon: Daryll Brod, MD;  Location: [facility];  Service: Orthopedics;  Laterality: Right;   DIAGNOSTIC LAPAROSCOPY     for pregnancy   METATARSAL OSTEOTOMY WITH BUNIONECTOMY Left 01/29/2017   TRIGGER FINGER RELEASE Right 11/16/2014   Procedure: RELEASE TRIGGER FINGER/A-1 PULLEY;  Surgeon: Daryll Brod, MD;  Location: [place];  Service: Orthopedics;  Laterality: Right;    There were no vitals filed for this visit.    Subjective Assessment - 01/31/21 1536     Subjective Pt reports that she is leaking with laughing, coughing, and jumping. Symptoms  have been getting worse gradually over the last 4 years and are always worse when she is standing. She has not been using pads for protection, but states that she probably should be; she does have to change underwear. Episodes of SUI occur daily, 2-3x. She also reports urinary urgency, which she believes may be in part due to fear of leaking. She has cup of coffee each morning, tea at lunch and dinner (attempts decaffienated), and 24-36oz of water a day; she does note that caffiene causes increased urgency. Denies FI/constipation; occasional straining with BM. No children. Denies bulging/pressure. Denies pain with intercourse. She does report LBP that is bil; previous diagnoses of bulging discs years ago.    Limitations Standing    Patient Stated Goals decrease urinary leaking with activity    Currently in Pain? No/denies                [facility] PT Assessment - 02/01/21 0001       Assessment   Medical Diagnosis N39.3 (ICD-10-CM) - Stress incontinence    Referring Provider (PT) Hale Bogus MD.    Onset Date/Surgical Date 01/25/17    Prior Therapy No      Precautions   Precautions None      Restrictions   Weight Bearing Restrictions No      Balance Screen   Has the patient fallen in the past 6 months No  Home Environment   Living Environment Private residence    Living Arrangements Spouse/significant other      Prior Function   Level of Independence Independent    Vocation Full time employment      Cognition   Overall Cognitive Status Within Functional Limits for tasks assessed      Functional Tests   Functional tests Single leg stance      Single Leg Stance   Comments reduced Rt SLS with mild trendelenburg      Posture/Postural Control   Posture/Postural Control Postural limitations    Posture Comments Rt. weight shift      AROM   Overall AROM  --   75% L SB; Bil hip IR 75%     Strength   Right Hip Flexion 4+/5      Flexibility   Soft Tissue Assessment  /Muscle Length yes    Hamstrings 80% bil      Palpation   SI assessment  anterior rotation L pelvic    Palpation comment Tight thoracic/lumbar paraspinals                        Objective measurements completed on examination: See above findings.     Pelvic Floor Special Questions - 02/01/21 0001     Prior Pelvic/Prostate Exam Yes    Prior Pregnancies No    Currently Sexually Active Yes    Is this Painful No    Urinary Leakage Yes    How often 2-3x/day    Pad use no    Activities that cause leaking Coughing;Sneezing;Exercising    Urinary urgency Yes    Fecal incontinence No    Fluid intake 24-36oz/day    Caffeine beverages morning coffee    Falling out feeling (prolapse) No    Skin Integrity Intact    Scar none    Perineal Body/Introitus  Normal    Prolapse None    Pelvic Floor Internal Exam Pt identity confirmed and verbal consent given.    Exam Type Vaginal    Palpation increased tightness Lt peri-urethral area (pt stands on Lt side); LA tight, Rt>Lt    Strength weak squeeze, no lift    Strength # of reps 8   increased tension s/p contractions   Strength # of seconds 4   Gradual let-go from onset of contraction   Tone had difficulty relaxing after contracting            No emotional/communication barriers or cognitive limitation. Patient is motivated to learn. Patient understands and agrees with treatment goals and plan. PT explains patient will be examined in standing, sitting, and lying down to see how their muscles and joints work. When they are ready, they will be asked to remove their underwear so PT can examine their perineum. The patient is also given the option of providing their own chaperone as one is not provided in our facility. The patient also has the right and is explained the right to defer or refuse any part of the evaluation or treatment including the internal exam. With the patient's consent, PT will use one gloved finger to gently  assess the muscles of the pelvic floor, seeing how well it contracts and relaxes and if there is muscle symmetry. After, the patient will get dressed and PT and patient will discuss exam findings and plan of care. PT and patient discuss plan of care, schedule, attendance policy and HEP activities.   [place] Adult PT Treatment/Exercise -  02/01/21 0001       Exercises   Exercises Lumbar;Knee/Hip      Lumbar Exercises: Stretches   Other Lumbar Stretch Exercise LTR 10x; Happy baby 2 min;      Lumbar Exercises: Seated   Other Seated Lumbar Exercises SB stretch 3x bil      Lumbar Exercises: Supine   Other Supine Lumbar Exercises BKFI 10x      Knee/Hip Exercises: Stretches   Active Hamstring Stretch Both;3 reps;20 seconds                     PT Education - 01/31/21 1609     Education Details Pt education performed on PFM anatomy, importance of PFM relaxation and strengthening, and treatment details; Access Code: [email or web address]    Person(s) Educated Patient    Methods Explanation;Verbal cues;Handout    Comprehension Verbalized understanding              PT Short Term Goals - 01/31/21 1633       PT SHORT TERM GOAL #1   Title Pt will be independent with HEP in 2 weeks.    Time 2    Period Weeks    Status New    Target Date 02/14/21      PT SHORT TERM GOAL #2   Title Pt will be independent and be able to teach back the knack and urge suppression technique in order to improve continence with daily activities and exercise in 4 weeks.    Time 4    Period Weeks    Status New    Target Date 02/28/21               PT Long Term Goals - 01/31/21 1635       PT LONG TERM GOAL #1   Title Pt will increase PFM strength to 4/5 or greater in 6 weeks in order to improve continence in all settings.    Time 6    Period Weeks    Status New    Target Date 03/14/21      PT LONG TERM GOAL #2   Title Pt will report no episodes of urinary incontinence with any activity in 8  weeks.    Time 8    Period Weeks    Status New    Target Date 03/28/21      PT LONG TERM GOAL #3   Title Pt will be able to sustain pelvic floor contraction for at least 10 seconds for endurance needed for bout of coughing or sneezing without leakage    Time 12    Period Weeks    Status New    Target Date 03/28/21      PT LONG TERM GOAL #4   Title pt will be ind with advanced HEP to maintain improved strength and active, healthy lifestyle    Time 12    Period Weeks    Status New    Target Date 03/28/21                    Plan - 01/31/21 1623     Clinical Impression Statement Pt presents to skilled PT due to bladder leakage that has been ongoing for years.  Pt reports not using pads at this time but she could use them sometimes.  Pt demonstrates posture abdnormalities as mentioned above and pelvic obliquiteis.  Pt has increased tone in the pelvic floor with difficulty relaxing the muscles after they are contracted.  Pt has more tension throughout the right side of her body including the pelvic floor. Pt has 2/5 MMT and sustained contraction for 4 seconds.  Pt was able to complete 8 quick flick diminished strength at the end.  Pt also demonstrate some reduced hip ROM and tight hamstring and tension throughout lumbar and thoracic paraspinals. Pt will benefit from skilled PT to address all impairments so she can improve function without leakage.    Personal Factors and Comorbidities Age;Comorbidity 1;Comorbidity 2    Comorbidities Abdominal surgery, lumpectomy    Examination-Activity Limitations Continence;Squat;Other   jumping   Examination-Participation Restrictions Other;Community Activity   exercise   Stability/Clinical Decision Making Stable/Uncomplicated    Clinical Decision Making Low    Rehab Potential Excellent    PT Frequency 1x / week    PT Duration 12 weeks    PT Treatment/Interventions ADLs/Self Care Home Management;Biofeedback;Electrical Stimulation;Neuromuscular  re-education;Therapeutic exercise;Therapeutic activities;Manual techniques;Dry needling;Scar mobilization;Joint Manipulations;Cryotherapy;Moist Heat;Patient/family education;Passive range of motion;Taping    PT Next Visit Plan Progress [place] coordination training; conitnue stretches and begin PFM/whole body strengthening; discuss healthy bladder habits    PT Home Exercise Plan Access Code: [email or web address]    Consulted and Agree with Plan of Care Patient             Patient will benefit from skilled therapeutic intervention in order to improve the following deficits and impairments:  Decreased balance, Decreased coordination, Decreased endurance, Decreased mobility, Decreased strength, Increased muscle spasms, Decreased scar mobility, Pain, Postural dysfunction  Visit Diagnosis: Muscle weakness (generalized)  Abnormal posture  Unspecified lack of coordination     Problem List Patient Active Problem List   Diagnosis Date Noted   Screening for endocrine, nutritional, metabolic and immunity disorder 01/18/2021   Seasonal allergic rhinitis due to pollen 01/18/2021   Tonsillith 01/18/2021   Chronic nasal congestion 12/31/2017   Rhinitis, chronic 12/31/2017   Depression 10/10/2017   Mass of right breast 02/22/2017   Tennis Must Quervain's disease (radial styloid tenosynovitis) 09/12/2015   Carpal tunnel syndrome on right 12/13/2014    Jule Ser, PT 02/01/2021, 8:40 AM  [place] @ [facility] [place], [place], [address] Phone: [phone]   Fax:  [phone]  Name: Kelli Moore MRN: [id number] Date of Birth: 13-Jun-1960

## 2021-02-01 ENCOUNTER — Other Ambulatory Visit (HOSPITAL_BASED_OUTPATIENT_CLINIC_OR_DEPARTMENT_OTHER): Payer: Self-pay | Admitting: Obstetrics & Gynecology

## 2021-02-01 DIAGNOSIS — F5104 Psychophysiologic insomnia: Secondary | ICD-10-CM

## 2021-02-03 ENCOUNTER — Other Ambulatory Visit (HOSPITAL_BASED_OUTPATIENT_CLINIC_OR_DEPARTMENT_OTHER): Payer: Self-pay | Admitting: Obstetrics & Gynecology

## 2021-02-03 MED ORDER — ZOLPIDEM TARTRATE 5 MG PO TABS: 5.0000 mg | ORAL_TABLET | Freq: Every evening | ORAL | 1 refills | Status: DC | PRN

## 2021-02-03 NOTE — Telephone Encounter (Signed)
DPR reviewed. LMOVM that Rx refill for Kelli Moore was sent to pharmacy. Advised that a 5mg  dosage was sent as that is the recommendation for women and anyone over 65. Advised to call back with any questions.

## 2021-02-09 ENCOUNTER — Other Ambulatory Visit: Payer: Self-pay

## 2021-02-09 ENCOUNTER — Encounter: Payer: Self-pay | Admitting: Physical Therapy

## 2021-02-09 ENCOUNTER — Ambulatory Visit: Payer: 59 | Admitting: Physical Therapy

## 2021-02-09 DIAGNOSIS — R293 Abnormal posture: Secondary | ICD-10-CM

## 2021-02-09 DIAGNOSIS — R279 Unspecified lack of coordination: Secondary | ICD-10-CM

## 2021-02-09 DIAGNOSIS — M6281 Muscle weakness (generalized): Secondary | ICD-10-CM | POA: Diagnosis not present

## 2021-02-09 NOTE — Therapy (Addendum)
[place] @ [address] [place], [place], [address] Phone: [phone]   Fax:  [phone]  Physical Therapy Treatment  Patient Details  Name: Kelli Moore MRN: [id number] Date of Birth: January 09, 1961 Referring Provider (PT): Hale Bogus MD.   Encounter Date: 02/09/2021   PT End of Session - 02/09/21 1540     Visit Number 2    Number of Visits 13    Date for PT Re-Evaluation 04/25/21    Authorization Type Friday    PT Start Time 1535    PT Stop Time 1605    PT Time Calculation (min) 30 min    Activity Tolerance Patient tolerated treatment well    Behavior During Therapy [facility] for tasks assessed/performed             Past Medical History:  Diagnosis Date   Anxiety    Depression    GERD (gastroesophageal reflux disease)    Mass of right breast 02/2017    Past Surgical History:  Procedure Laterality Date   ABDOMINAL SURGERY  2020   LIPOSUCTION   BREAST BIOPSY Right    BREAST LUMPECTOMY WITH RADIOACTIVE SEED LOCALIZATION Right 03/15/2017   Procedure: RIGHT BREAST LUMPECTOMY WITH RADIOACTIVE SEED [place];  Surgeon: Jovita Kussmaul, MD;  Location: [place];  Service: General;  Laterality: Right;   BUNIONECTOMY Right 2016   CARPAL TUNNEL RELEASE Right 11/16/2014   Procedure: CARPAL TUNNEL RELEASE;  Surgeon: Daryll Brod, MD;  Location: [place] [facility];  Service: Orthopedics;  Laterality: Right;   DIAGNOSTIC LAPAROSCOPY     for pregnancy   METATARSAL OSTEOTOMY WITH BUNIONECTOMY Left 01/29/2017   TRIGGER FINGER RELEASE Right 11/16/2014   Procedure: RELEASE TRIGGER FINGER/A-1 PULLEY;  Surgeon: Daryll Brod, MD;  Location: [place];  Service: Orthopedics;  Laterality: Right;    There were no vitals filed for this visit.   Subjective Assessment - 02/09/21 1543     Subjective Stepping hard and laughing and coughing can cause leakage.  Still drinking tea and  coffee.  Stretches feel good    Patient Stated Goals decrease urinary leaking with activity    Currently in Pain? No/denies                               [facility] Adult PT Treatment/Exercise - 02/09/21 0001       Self-Care   Self-Care Other Self-Care Comments    Other Self-Care Comments  eliminate tea; knack      Neuro Re-ed    Neuro Re-ed Details  exhale with exertion techniques and isolating and incorporating pelvic floor into movements      Lumbar Exercises: Stretches   Other Lumbar Stretch Exercise child pose with breathing      Lumbar Exercises: Standing   Wall Slides Limitations pelvic tilt with lifting 3lb- exhale and kegel    Other Standing Lumbar Exercises lean on table and staggered stance  2 x 10                     PT Education - 02/09/21 1639     Education Details Access Code: [email or web address] and knack    Person(s) Educated Patient    Methods Explanation;Demonstration;Tactile cues;Verbal cues;Handout    Comprehension Returned demonstration;Verbalized understanding              PT Short Term Goals - 02/09/21 1639  PT SHORT TERM GOAL #1   Title Pt will be independent with HEP in 2 weeks.    Status Achieved      PT SHORT TERM GOAL #2   Title Pt will be independent and be able to teach back the knack and urge suppression technique in order to improve continence with daily activities and exercise in 4 weeks.    Status On-going               PT Long Term Goals - 01/31/21 1635       PT LONG TERM GOAL #1   Title Pt will increase PFM strength to 4/5 or greater in 6 weeks in order to improve continence in all settings.    Time 6    Period Weeks    Status New    Target Date 03/14/21      PT LONG TERM GOAL #2   Title Pt will report no episodes of urinary incontinence with any activity in 8 weeks.    Time 8    Period Weeks    Status New    Target Date 03/28/21      PT LONG TERM GOAL #3   Title Pt will be able to  sustain pelvic floor contraction for at least 10 seconds for endurance needed for bout of coughing or sneezing without leakage    Time 12    Period Weeks    Status New    Target Date 03/28/21      PT LONG TERM GOAL #4   Title pt will be ind with advanced HEP to maintain improved strength and active, healthy lifestyle    Time 12    Period Weeks    Status New    Target Date 03/28/21                   Plan - 02/09/21 1548     Clinical Impression Statement No goals met today due to initial f/u since eval.  Pt was educated on lessening the caffiene intake.  Education also done on muscle coordination of the pelvic floor with exercises that she is doing at the gym.  Pt able to correctly isolate the pelvic floor and exhale with exertion.  Pt was given updates to HEP and will benefit from skilled PT to continue per POC.    PT Treatment/Interventions ADLs/Self Care Home Management;Biofeedback;Electrical Stimulation;Neuromuscular re-education;Therapeutic exercise;Therapeutic activities;Manual techniques;Dry needling;Scar mobilization;Joint Manipulations;Cryotherapy;Moist Heat;Patient/family education;Passive range of motion;Taping    PT Next Visit Plan progress core and PF strength training; f/u on eliminating tea    PT Home Exercise Plan Access Code: [email or web address]    Consulted and Agree with Plan of Care Patient             Patient will benefit from skilled therapeutic intervention in order to improve the following deficits and impairments:  Decreased balance, Decreased coordination, Decreased endurance, Decreased mobility, Decreased strength, Increased muscle spasms, Decreased scar mobility, Pain, Postural dysfunction  Visit Diagnosis: Muscle weakness (generalized)  Abnormal posture  Unspecified lack of coordination     Problem List Patient Active Problem List   Diagnosis Date Noted   Screening for endocrine, nutritional, metabolic and immunity disorder 01/18/2021   Seasonal  allergic rhinitis due to pollen 01/18/2021   Tonsillith 01/18/2021   Chronic nasal congestion 12/31/2017   Rhinitis, chronic 12/31/2017   Depression 10/10/2017   Mass of right breast 02/22/2017   Tennis Must Quervain's disease (radial styloid tenosynovitis) 09/12/2015   Carpal tunnel syndrome on right  12/13/2014    Jule Ser, PT 02/09/2021, 4:45 PM  [place] @ [place], [place], [address] Phone: [phone]   Fax:  [phone]  Name: Kelli Moore MRN: [id number] Date of Birth: [birth year]   PHYSICAL THERAPY DISCHARGE SUMMARY  Visits from Start of Care: 2  Current functional level related to goals / functional outcomes:   See above goals Remaining deficits: See above   Education / Equipment: HEP   Patient agrees to discharge. Patient goals were not met. Patient is being discharged due to not returning since the last visit.  Gustavus Bryant, PT 07/06/21 9:19 AM

## 2021-02-09 NOTE — Patient Instructions (Signed)
Access Code: [email or web address] URL: [email or web address] Date: 02/09/2021 Prepared by: Jari Favre  Exercises Supine Lower Trunk Rotation - 1 x daily - 7 x weekly - 1 sets - 10 reps - 5 sec hold Seated Side Stretch - 1 x daily - 7 x weekly - 3 sets - 10 reps Happy Baby with Pelvic Floor Lengthening - 1 x daily - 7 x weekly - 1 sets - 3 reps - 30 hold Seated Hamstring Stretch - 1 x daily - 7 x weekly - 1 sets - 3 reps - 30 sec hold Standing Low Back Flexion at Table - 3 x daily - 7 x weekly - 1 sets - 8 reps Standing Shoulder Flexion to 90 Degrees with Dumbbells - 1 x daily - 7 x weekly - 1 sets - 10 reps Bird Dog - 1 x daily - 7 x weekly - 1 sets - 10 reps

## 2021-02-21 ENCOUNTER — Telehealth (HOSPITAL_BASED_OUTPATIENT_CLINIC_OR_DEPARTMENT_OTHER): Payer: Self-pay

## 2021-02-21 DIAGNOSIS — F3289 Other specified depressive episodes: Secondary | ICD-10-CM

## 2021-02-21 NOTE — Telephone Encounter (Signed)
Patient states her Prozac was increased at her last visit. She has starting breaking out and believes that it is due to the increase as this is the only thing that has changed. Patient is wanting to be advised on what she should do. Please advise.

## 2021-02-22 ENCOUNTER — Telehealth (HOSPITAL_BASED_OUTPATIENT_CLINIC_OR_DEPARTMENT_OTHER): Payer: Self-pay | Admitting: Obstetrics & Gynecology

## 2021-02-22 NOTE — Telephone Encounter (Signed)
Patient called and left message for Tonya to please call her back.

## 2021-02-22 NOTE — Telephone Encounter (Signed)
Tried contacting patient without success. LMOM at 10:03 for patient to call office to receive instructions on Prozac. tbw

## 2021-02-24 MED ORDER — FLUOXETINE HCL 20 MG PO CAPS: 20.0000 mg | ORAL_CAPSULE | Freq: Every day | ORAL | 3 refills | Status: DC

## 2021-02-24 NOTE — Telephone Encounter (Signed)
Called and spoke with patient. Informed patient per Dr. Sabra Heck to go back to Prozac 20mg  daily and see if symptoms subside. Advised patient to give our office a call if the symptoms do not go away. Medication was sent into the requested pharmacy. tbw

## 2021-02-28 ENCOUNTER — Encounter: Payer: Self-pay | Admitting: Physician Assistant

## 2021-02-28 ENCOUNTER — Ambulatory Visit (INDEPENDENT_AMBULATORY_CARE_PROVIDER_SITE_OTHER): Payer: 59 | Admitting: Physician Assistant

## 2021-02-28 ENCOUNTER — Other Ambulatory Visit: Payer: Self-pay

## 2021-02-28 DIAGNOSIS — Z1283 Encounter for screening for malignant neoplasm of skin: Secondary | ICD-10-CM | POA: Diagnosis not present

## 2021-02-28 DIAGNOSIS — L820 Inflamed seborrheic keratosis: Secondary | ICD-10-CM | POA: Diagnosis not present

## 2021-02-28 DIAGNOSIS — Z808 Family history of malignant neoplasm of other organs or systems: Secondary | ICD-10-CM | POA: Diagnosis not present

## 2021-02-28 DIAGNOSIS — Z85828 Personal history of other malignant neoplasm of skin: Secondary | ICD-10-CM | POA: Diagnosis not present

## 2021-02-28 DIAGNOSIS — L2084 Intrinsic (allergic) eczema: Secondary | ICD-10-CM

## 2021-02-28 MED ORDER — TRIAMCINOLONE ACETONIDE 0.1 % EX CREA: 1.0000 "application " | TOPICAL_CREAM | Freq: Every day | CUTANEOUS | 11 refills | Status: DC

## 2021-03-01 ENCOUNTER — Encounter (HOSPITAL_BASED_OUTPATIENT_CLINIC_OR_DEPARTMENT_OTHER): Payer: Self-pay | Admitting: *Deleted

## 2021-03-13 ENCOUNTER — Other Ambulatory Visit: Payer: Self-pay | Admitting: Obstetrics & Gynecology

## 2021-03-13 ENCOUNTER — Ambulatory Visit: Payer: 59

## 2021-03-13 DIAGNOSIS — Z1231 Encounter for screening mammogram for malignant neoplasm of breast: Secondary | ICD-10-CM

## 2021-03-14 ENCOUNTER — Encounter: Payer: 59 | Admitting: Physical Therapy

## 2021-03-19 ENCOUNTER — Encounter: Payer: Self-pay | Admitting: Physician Assistant

## 2021-03-19 NOTE — Progress Notes (Addendum)
° °  Follow-Up Visit   Subjective  Kelli Moore is a 61 y.o. female who presents for the following: Annual Exam (Patient here today for yearly skin check. Per patient she has a few lesions that came up on her abdomen, back, right arm and buttocks x 1 month per patient the lesions are itchy, red, and scaly. Per patient no bleeding and no pain. Personal history and family history of non mole skin cancer. No personal history or family history of atypical moles or melanoma.  ).   The following portions of the chart were reviewed this encounter and updated as appropriate:  Tobacco   Allergies   Meds   Problems   Med Hx   Surg Hx   Fam Hx       Objective  Well appearing patient in no apparent distress; mood and affect are within normal limits.  A full examination was performed including scalp, head, eyes, ears, nose, lips, neck, chest, axillae, abdomen, back, buttocks, bilateral upper extremities, bilateral lower extremities, hands, feet, fingers, toes, fingernails, and toenails. All findings within normal limits unless otherwise noted below.  Head to Toe No atypical nevi No signs of non-mole skin cancer.   Left Abdomen (side) - Upper Ill-defined pink papules/plaques with scale-crust.   Left Lower Leg - Anterior, Right Lower Leg - Anterior Stuck-on, crusty papules and plaques.       Assessment & Plan  Skin exam for malignant neoplasm Head to Toe  Yearly skin check.   Intrinsic (allergic) eczema Left Abdomen (side) - Upper  triamcinolone cream (KENALOG) 0.1 % - Left Abdomen (side) - Upper Apply 1 application topically daily.  Inflamed seborrheic keratosis (2) Left Lower Leg - Anterior; Right Lower Leg - Anterior  Destruction of lesion - Left Lower Leg - Anterior, Right Lower Leg - Anterior Complexity: simple   Destruction method: cryotherapy   Informed consent: discussed and consent obtained   Timeout:  patient name, date of birth, surgical site, and procedure verified Lesion  destroyed using liquid nitrogen: Yes   Cryotherapy cycles:  1 Outcome: patient tolerated procedure well with no complications   Post-procedure details: wound care instructions given      I, Tyus Kallam, PA-C, have reviewed all documentation's for this visit.  The documentation on 03/26/21 for the exam, diagnosis, procedures and orders are all accurate and complete.

## 2021-03-21 ENCOUNTER — Institutional Professional Consult (permissible substitution): Payer: 59 | Admitting: Neurology

## 2021-03-22 ENCOUNTER — Encounter: Payer: 59 | Admitting: Physical Therapy

## 2021-03-22 ENCOUNTER — Ambulatory Visit (INDEPENDENT_AMBULATORY_CARE_PROVIDER_SITE_OTHER): Payer: 59

## 2021-03-22 ENCOUNTER — Other Ambulatory Visit: Payer: Self-pay

## 2021-03-22 DIAGNOSIS — Z1231 Encounter for screening mammogram for malignant neoplasm of breast: Secondary | ICD-10-CM | POA: Diagnosis not present

## 2021-03-24 ENCOUNTER — Institutional Professional Consult (permissible substitution): Payer: Self-pay | Admitting: Neurology

## 2021-03-30 ENCOUNTER — Encounter: Payer: 59 | Admitting: Physical Therapy

## 2021-04-12 ENCOUNTER — Other Ambulatory Visit (HOSPITAL_BASED_OUTPATIENT_CLINIC_OR_DEPARTMENT_OTHER): Payer: Self-pay | Admitting: Obstetrics & Gynecology

## 2021-04-18 NOTE — Telephone Encounter (Signed)
LMOVM for pt to call office 

## 2021-04-19 NOTE — Telephone Encounter (Signed)
Spoke with pt and advised that neurology consult was recommended before additional refills would be sent in for [facility]. Pt to call and set up appt at [facility] ?

## 2021-05-03 NOTE — Progress Notes (Signed)
?  Orma Render, DNP, AGNP-c ?Primary Care & Sports Medicine ?[place], [place] [address] ?[phone] ? ?Subjective:  ? ?Kelli Moore is a 61 y.o. female presents to day for sore throat, fever, cough.  ?Kelli Moore reports onset of sore throat, fever, and cough that has been present since last week.  ?She is concerned she has strep ?She and her husband are both ill with the same symptoms.  ?Has been taking OTC medications without relief ? ?Past medical history: Updated in chart ?Medications: reviewed medication list in the chart ?Allergies: reviewed allergy section in the chart ?Review of Systems: Negative for chest pain and shortness of breath ? ? ? ?Objective:  ?LMP 09/14/2010  ?Physical Exam ?Vitals and nursing note reviewed.  ?Constitutional:   ?   Appearance: She is ill-appearing.  ?HENT:  ?   Head: Normocephalic.  ?   Right Ear: A middle ear effusion is present.  ?   Left Ear: A middle ear effusion is present.  ?   Nose: No congestion.  ?   Mouth/Throat:  ?   Mouth: Mucous membranes are moist.  ?   Pharynx: Uvula midline. Posterior oropharyngeal erythema present. No oropharyngeal exudate.  ?   Tonsils: No tonsillar exudate or tonsillar abscesses.  ?Eyes:  ?   Extraocular Movements: Extraocular movements intact.  ?   Conjunctiva/sclera: Conjunctivae normal.  ?   Pupils: Pupils are equal, round, and reactive to light.  ?Cardiovascular:  ?   Rate and Rhythm: Normal rate and regular rhythm.  ?   Pulses: Normal pulses.  ?   Heart sounds: Normal heart sounds.  ?Pulmonary:  ?   Effort: Pulmonary effort is normal.  ?   Breath sounds: Normal breath sounds.  ?Lymphadenopathy:  ?   Cervical: Cervical adenopathy present.  ?Skin: ?   General: Skin is warm and dry.  ?   Capillary Refill: Capillary refill takes less than 2 seconds.  ?Neurological:  ?   General: No focal deficit present.  ?   Mental Status: She is alert and oriented to person, place, and time.  ?Psychiatric:     ?    Mood and Affect: Mood normal.     ?   Behavior: Behavior normal.     ?   Thought Content: Thought content normal.     ?   Judgment: Judgment normal.  ? ? ? ?    ? ?Assessment & Plan:  ?   ?Problem List Items Addressed This Visit   ? ? Fever with sore throat - Primary  ?  Strep negative. Suspect bacterial etiology given length of time symptoms have been present. No alarm sx present at this time. Will treat with augmentin and supportive treatment and monitor for symptom improvement.  ?If no improvement upon antibiotic completion, patient will f/u ? ?  ?  ? Relevant Medications  ? amoxicillin-clavulanate (AUGMENTIN) 875-125 MG tablet  ?  ? ? ? ? ?History and Medication reviewed and updated this encounter.  ? ?Petr Bontempo, Coralee Pesa, NP MPH ?Orma Render, DNP, AGNP-c ?05/03/2021  9:01 PM   ? ?

## 2021-05-04 ENCOUNTER — Encounter (HOSPITAL_BASED_OUTPATIENT_CLINIC_OR_DEPARTMENT_OTHER): Payer: Self-pay | Admitting: Nurse Practitioner

## 2021-05-04 ENCOUNTER — Ambulatory Visit (INDEPENDENT_AMBULATORY_CARE_PROVIDER_SITE_OTHER): Payer: 59 | Admitting: Nurse Practitioner

## 2021-05-04 VITALS — BP 117/72 | HR 97 | Temp 100.5°F | Ht 64.0 in | Wt 155.8 lb

## 2021-05-04 DIAGNOSIS — J029 Acute pharyngitis, unspecified: Secondary | ICD-10-CM | POA: Insufficient documentation

## 2021-05-04 DIAGNOSIS — R509 Fever, unspecified: Secondary | ICD-10-CM

## 2021-05-04 MED ORDER — AMOXICILLIN-POT CLAVULANATE 875-125 MG PO TABS: 1.0000 | ORAL_TABLET | Freq: Two times a day (BID) | ORAL | 0 refills | Status: DC

## 2021-05-04 NOTE — Patient Instructions (Addendum)
It was a pleasure seeing you today. I hope your time spent with [place] was pleasant and helpful. Please let us know if there is anything we can do to improve the service you receive.  ? ?Alternate Tylenol '1000mg'$  and Ibuprofen 400-'600mg'$  every 4 hours to keep it in your system.  ?Use throat spray and lozenges to help with the throat pain.  ?Warm tea and honey can also be helpful.  ?I have sent in Augmentin for you and Ronalee Belts.  ?If you are not feeling any better by Monday, please let me know.  ? ?Important Office Information ?Lab Results ?If labs were ordered, please note that you will see results through [place] as soon as they come available from [place].  ?It takes up to 5 business days for the results to be routed to me and for me to review them once all of the lab results have come through from [facility]. I will make recommendations based on your results and send these through [place] or someone from the office will call you to discuss. If your labs are abnormal, we may contact you to schedule a visit to discuss the results and make recommendations.  ?If you have not heard from [place] within 5 business days or you have waited longer than a week and your lab results have not come through on [place], please feel free to call the office or send a message through [place] to follow-up on these labs.  ? ?Referrals ?If referrals were placed today, the office where the referral was sent will contact you either by phone or through [place] to set up scheduling. Please note that it can take up to a week for the referral office to contact you. If you do not hear from them in a week, please contact the referral office directly to inquire about scheduling.  ? ?Condition Treated ?If your condition worsens or you begin to have new symptoms, please schedule a follow-up appointment for further evaluation. If you are not sure if an appointment is needed, you may call the office to leave a message for the nurse and someone will contact you with  recommendations.  ?If you have an urgent or life threatening emergency, please do not call the office, but seek emergency evaluation by calling 911 or going to the nearest emergency room for evaluation.  ? ?MyChart and Phone Calls ?Please do not use MyChart for urgent messages. It may take up to 3 business days for MyChart messages to be read by staff and if they are unable to handle the request, an additional 3 business days for them to be routed to me and for my response.  ?Messages sent to the provider through [place] do not come directly to the provider, please allow time for these messages to be routed and for me to respond.  ?We get a large volume of MyChart messages daily and these are responded to in the order received.  ? ?For urgent messages, please call the office at [phone] and speak with the front office staff or leave a message on the line of my assistant for guidance.  ?We are seeing patients from the hours of 8:00 am through 5:00 pm and calls directly to the nurse may not be answered immediately due to seeing patients, but your call will be returned as soon as possible.  ?Phone  messages received after 4:00 PM Monday through Thursday may not be returned until the following business day. Phone messages received after 11:00 AM on Friday may  not be returned until Monday.  ? ?After Hours ?We share on call hours with providers from other offices. If you have an urgent need after hours that cannot wait until the next business day, please contact the on call provider by calling the office number. A nurse will speak with you and contact the provider if needed for recommendations.  ?If you have an urgent or life threatening emergency after hours, please do not call the on call provider, but seek emergency evaluation by calling 911 or going to the nearest emergency room for evaluation.  ? ?Paperwork ?All paperwork requires a minimum of 5 days to complete and return to you or the designated personnel.  Please keep this in mind when bringing in forms or sending requests for paperwork completion to the office.  ?  ?

## 2021-05-05 ENCOUNTER — Other Ambulatory Visit (HOSPITAL_BASED_OUTPATIENT_CLINIC_OR_DEPARTMENT_OTHER): Payer: Self-pay

## 2021-05-05 DIAGNOSIS — J029 Acute pharyngitis, unspecified: Secondary | ICD-10-CM

## 2021-05-05 MED ORDER — FLUCONAZOLE 150 MG PO TABS: 150.0000 mg | ORAL_TABLET | Freq: Once | ORAL | 0 refills | Status: AC

## 2021-05-09 ENCOUNTER — Ambulatory Visit (INDEPENDENT_AMBULATORY_CARE_PROVIDER_SITE_OTHER): Payer: 59 | Admitting: Nurse Practitioner

## 2021-05-09 ENCOUNTER — Encounter (HOSPITAL_BASED_OUTPATIENT_CLINIC_OR_DEPARTMENT_OTHER): Payer: Self-pay | Admitting: Nurse Practitioner

## 2021-05-09 ENCOUNTER — Other Ambulatory Visit (HOSPITAL_BASED_OUTPATIENT_CLINIC_OR_DEPARTMENT_OTHER): Payer: Self-pay

## 2021-05-09 DIAGNOSIS — J020 Streptococcal pharyngitis: Secondary | ICD-10-CM | POA: Diagnosis not present

## 2021-05-09 DIAGNOSIS — J029 Acute pharyngitis, unspecified: Secondary | ICD-10-CM

## 2021-05-09 LAB — POCT RAPID STREP A (OFFICE): Rapid Strep A Screen: POSITIVE — AB

## 2021-05-09 MED ORDER — LIDOCAINE VISCOUS HCL 2 % MT SOLN
15.0000 mL | OROMUCOSAL | 1 refills | Status: DC | PRN
Start: 2021-05-09 — End: 2021-05-09

## 2021-05-09 MED ORDER — ZOLPIDEM TARTRATE 10 MG PO TABS: 10.0000 mg | ORAL_TABLET | Freq: Every evening | ORAL | 1 refills | Status: DC | PRN

## 2021-05-09 MED ORDER — PREDNISONE 20 MG PO TABS: 20.0000 mg | ORAL_TABLET | Freq: Every day | ORAL | 0 refills | Status: DC

## 2021-05-09 MED ORDER — LIDOCAINE VISCOUS HCL 2 % MT SOLN: 15.0000 mL | OROMUCOSAL | 1 refills | Status: DC | PRN

## 2021-05-09 MED ORDER — CEFDINIR 300 MG PO CAPS: 300.0000 mg | ORAL_CAPSULE | Freq: Two times a day (BID) | ORAL | 0 refills | Status: DC

## 2021-05-09 NOTE — Assessment & Plan Note (Signed)
Strep negative. Suspect bacterial etiology given length of time symptoms have been present. No alarm sx present at this time. Will treat with augmentin and supportive treatment and monitor for symptom improvement.  ?If no improvement upon antibiotic completion, patient will f/u ?

## 2021-05-09 NOTE — Progress Notes (Signed)
? ?Acute Office Visit ? ?Subjective:  ? ?  ?Patient ID: Kelli Moore, female    DOB: 1960/01/25, 61 y.o.   MRN: [id number] ? ?No chief complaint on file. ? ? ?HPI ?Patient is in today for follow-up for sore throat and fever.  ?She was seen last week for symptoms of the same and started on antibiotic therapy.  ?She has completed the antibiotics, but her symptoms have not improved.  ?She endorses: ?Cough, severe sore throat, congestion, and ear pain ?No fevers for the last couple of days that she knows of ?Worse symptom is the sore throat ? ? ?ROS ?All review of systems negative except what is listed in the HPI ? ? ?   ?Objective:  ? ?Physical Exam ?Vitals and nursing note reviewed.  ?Constitutional:   ?   Appearance: She is ill-appearing.  ?HENT:  ?   Head: Normocephalic.  ?   Right Ear: Tenderness present. A middle ear effusion is present.  ?   Left Ear: A middle ear effusion is present.  ?   Nose:  ?   Right Turbinates: Enlarged and swollen.  ?   Left Turbinates: Enlarged and swollen.  ?   Right Sinus: No maxillary sinus tenderness or frontal sinus tenderness.  ?   Left Sinus: No maxillary sinus tenderness or frontal sinus tenderness.  ?   Mouth/Throat:  ?   Mouth: Mucous membranes are moist.  ?   Pharynx: Uvula midline. Oropharyngeal exudate and posterior oropharyngeal erythema present.  ?   Tonsils: Tonsillar exudate present. No tonsillar abscesses.  ?Cardiovascular:  ?   Rate and Rhythm: Normal rate and regular rhythm.  ?   Pulses: Normal pulses.  ?   Heart sounds: Normal heart sounds.  ?Pulmonary:  ?   Effort: Pulmonary effort is normal.  ?   Breath sounds: Wheezing present.  ?Abdominal:  ?   General: Bowel sounds are normal.  ?   Palpations: Abdomen is soft.  ?Musculoskeletal:     ?   General: Normal range of motion.  ?Lymphadenopathy:  ?   Cervical: Cervical adenopathy present.  ?Skin: ?   General: Skin is warm and dry.  ?   Capillary Refill: Capillary refill takes less than 2 seconds.  ?Neurological:  ?    General: No focal deficit present.  ?   Mental Status: She is alert and oriented to person, place, and time.  ?Psychiatric:     ?   Mood and Affect: Mood normal.     ?   Behavior: Behavior normal.     ?   Thought Content: Thought content normal.     ?   Judgment: Judgment normal.  ? ? ?Results for orders placed or performed in visit on 05/09/21  ?POCT rapid strep A  ?Result Value Ref Range  ? Rapid Strep A Screen Positive (A) Negative  ? ? ? ?   ?Assessment & Plan:  ? ?Problem List Items Addressed This Visit   ? ? Sore throat - Primary  ?  Positive strep test in office today. Patient has completed antibiotic therapy, but this has not been effective in resolving her symptoms. We will start new antibiotic therapy today and start oral steroids for inflammation of the throat and ears. Recommend close monitoring for symptoms and report if symptoms worsen or fail to improve immediately.  ? ?  ?  ? Relevant Orders  ? POCT rapid strep A (Completed)  ? ?Other Visit Diagnoses   ? ? Streptococcal  pharyngitis      ? Relevant Medications  ? cefdinir (OMNICEF) 300 MG capsule  ? predniSONE (DELTASONE) 20 MG tablet  ? ?  ? ? ?Meds ordered this encounter  ?Medications  ? cefdinir (OMNICEF) 300 MG capsule  ?  Sig: Take 1 capsule (300 mg total) by mouth 2 (two) times daily.  ?  Dispense:  14 capsule  ?  Refill:  0  ? predniSONE (DELTASONE) 20 MG tablet  ?  Sig: Take 1 tablet (20 mg total) by mouth daily with breakfast.  ?  Dispense:  5 tablet  ?  Refill:  0  ? DISCONTD: lidocaine (XYLOCAINE) 2 % solution  ?  Sig: Use as directed 15 mLs in the mouth or throat every 3 (three) hours as needed (mouth/throat pain - gargle and spit).  ?  Dispense:  100 mL  ?  Refill:  1  ? ? ?Return if symptoms worsen or fail to improve. ? ?Orma Render, NP ? ? ?

## 2021-05-09 NOTE — Patient Instructions (Signed)
It was a pleasure seeing you today. I hope your time spent with [place] was pleasant and helpful. Please let us know if there is anything we can do to improve the service you receive.  ? ?Today we discussed concerns with: ? ?Streptococcal pharyngitis ?Your strep test was positive today. I am also concerned with the mucous in your lungs.  ?I have sent in Cefdinir (also called omnicef) to treat this. You will take one pill twice a day for 7 days. Be sure to start this today, it is ok if you have the second dose less than 12 hours after the first dose.  ?I have also sent in a prednisone burst. This will help both the throat and the lungs by reducing swelling. You will take this once a day for 5 days. It is ok to take this today as soon as you get it and then take tomorrow's dose first thing in the morning ? ?I have sent in lidocaine to gargle with and spit out. You can use this every 3 hours to help with the sore throat. It may burn when you first use it, but should numb the throat well enough to give you some relief.  ? ? ? ?Important Office Information ?Lab Results ?If labs were ordered, please note that you will see results through [place] as soon as they come available from [place].  ?It takes up to 5 business days for the results to be routed to me and for me to review them once all of the lab results have come through from [facility]. I will make recommendations based on your results and send these through [place] or someone from the office will call you to discuss. If your labs are abnormal, we may contact you to schedule a visit to discuss the results and make recommendations.  ?If you have not heard from [place] within 5 business days or you have waited longer than a week and your lab results have not come through on [place], please feel free to call the office or send a message through [place] to follow-up on these labs.  ? ?Referrals ?If referrals were placed today, the office where the referral was sent will contact you  either by phone or through [place] to set up scheduling. Please note that it can take up to a week for the referral office to contact you. If you do not hear from them in a week, please contact the referral office directly to inquire about scheduling.  ? ?Condition Treated ?If your condition worsens or you begin to have new symptoms, please schedule a follow-up appointment for further evaluation. If you are not sure if an appointment is needed, you may call the office to leave a message for the nurse and someone will contact you with recommendations.  ?If you have an urgent or life threatening emergency, please do not call the office, but seek emergency evaluation by calling 911 or going to the nearest emergency room for evaluation.  ? ?MyChart and Phone Calls ?Please do not use MyChart for urgent messages. It may take up to 3 business days for MyChart messages to be read by staff and if they are unable to handle the request, an additional 3 business days for them to be routed to me and for my response.  ?Messages sent to the provider through [place] do not come directly to the provider, please allow time for these messages to be routed and for me to respond.  ?We get a large volume  of MyChart messages daily and these are responded to in the order received.  ? ?For urgent messages, please call the office at [phone] and speak with the front office staff or leave a message on the line of my assistant for guidance.  ?We are seeing patients from the hours of 8:00 am through 5:00 pm and calls directly to the nurse may not be answered immediately due to seeing patients, but your call will be returned as soon as possible.  ?Phone  messages received after 4:00 PM Monday through Thursday may not be returned until the following business day. Phone messages received after 11:00 AM on Friday may not be returned until Monday.  ? ?After Hours ?We share on call hours with providers from other offices. If you have an urgent  need after hours that cannot wait until the next business day, please contact the on call provider by calling the office number. A nurse will speak with you and contact the provider if needed for recommendations.  ?If you have an urgent or life threatening emergency after hours, please do not call the on call provider, but seek emergency evaluation by calling 911 or going to the nearest emergency room for evaluation.  ? ?Paperwork ?All paperwork requires a minimum of 5 days to complete and return to you or the designated personnel. Please keep this in mind when bringing in forms or sending requests for paperwork completion to the office.  ?  ?

## 2021-05-11 ENCOUNTER — Ambulatory Visit (HOSPITAL_BASED_OUTPATIENT_CLINIC_OR_DEPARTMENT_OTHER): Payer: 59 | Admitting: Nurse Practitioner

## 2021-05-11 ENCOUNTER — Encounter (HOSPITAL_BASED_OUTPATIENT_CLINIC_OR_DEPARTMENT_OTHER): Payer: Self-pay | Admitting: Nurse Practitioner

## 2021-05-12 ENCOUNTER — Ambulatory Visit (INDEPENDENT_AMBULATORY_CARE_PROVIDER_SITE_OTHER): Payer: 59 | Admitting: Nurse Practitioner

## 2021-05-12 DIAGNOSIS — J020 Streptococcal pharyngitis: Secondary | ICD-10-CM

## 2021-05-12 DIAGNOSIS — J029 Acute pharyngitis, unspecified: Secondary | ICD-10-CM | POA: Diagnosis not present

## 2021-05-15 ENCOUNTER — Ambulatory Visit (INDEPENDENT_AMBULATORY_CARE_PROVIDER_SITE_OTHER): Payer: 59 | Admitting: Nurse Practitioner

## 2021-05-15 DIAGNOSIS — R5081 Fever presenting with conditions classified elsewhere: Secondary | ICD-10-CM | POA: Diagnosis not present

## 2021-05-15 DIAGNOSIS — R5381 Other malaise: Secondary | ICD-10-CM | POA: Diagnosis not present

## 2021-05-15 DIAGNOSIS — J029 Acute pharyngitis, unspecified: Secondary | ICD-10-CM | POA: Insufficient documentation

## 2021-05-15 DIAGNOSIS — R5383 Other fatigue: Secondary | ICD-10-CM | POA: Diagnosis not present

## 2021-05-15 MED ORDER — PENICILLIN G BENZATHINE 2400000 UNIT/4ML IM SUSP: 2.4000 10*6.[IU] | Freq: Once | INTRAMUSCULAR | Status: DC

## 2021-05-15 MED ORDER — TRAMADOL HCL 50 MG PO TABS: 50.0000 mg | ORAL_TABLET | Freq: Three times a day (TID) | ORAL | 0 refills | Status: AC | PRN

## 2021-05-15 NOTE — Patient Instructions (Signed)
It was a pleasure seeing you today. I hope your time spent with [place] was pleasant and helpful. Please let us know if there is anything we can do to improve the service you receive.  ? ?Today we discussed concerns with: ? ?Sore throat ? ?Fever in other diseases ? ?Malaise and fatigue ? ?I will let you know what the labs say ? ?The following orders have been placed for you today: ? ?Orders Placed This Encounter  ?Procedures  ? CBC with Differential/Platelet  ?  Order Specific Question:   Release to patient  ?  Answer:   Immediate  ? Comprehensive metabolic panel  ?  Order Specific Question:   Has the patient fasted?  ?  Answer:   Yes  ?  Order Specific Question:   Release to patient  ?  Answer:   Immediate  ? EBV ab to viral capsid ag pnl, IgG+IgM  ? Lyme Disease Abs IgG, IgM, IFA, CSF  ? Ehrlichia Antibody Panel  ? ? ? ?Important Office Information ?Lab Results ?If labs were ordered, please note that you will see results through [place] as soon as they come available from [place].  ?It takes up to 5 business days for the results to be routed to me and for me to review them once all of the lab results have come through from [facility]. I will make recommendations based on your results and send these through [place] or someone from the office will call you to discuss. If your labs are abnormal, we may contact you to schedule a visit to discuss the results and make recommendations.  ?If you have not heard from [place] within 5 business days or you have waited longer than a week and your lab results have not come through on [place], please feel free to call the office or send a message through [place] to follow-up on these labs.  ? ?Referrals ?If referrals were placed today, the office where the referral was sent will contact you either by phone or through [place] to set up scheduling. Please note that it can take up to a week for the referral office to contact you. If you do not hear from them in a week, please contact the  referral office directly to inquire about scheduling.  ? ?Condition Treated ?If your condition worsens or you begin to have new symptoms, please schedule a follow-up appointment for further evaluation. If you are not sure if an appointment is needed, you may call the office to leave a message for the nurse and someone will contact you with recommendations.  ?If you have an urgent or life threatening emergency, please do not call the office, but seek emergency evaluation by calling 911 or going to the nearest emergency room for evaluation.  ? ?MyChart and Phone Calls ?Please do not use MyChart for urgent messages. It may take up to 3 business days for MyChart messages to be read by staff and if they are unable to handle the request, an additional 3 business days for them to be routed to me and for my response.  ?Messages sent to the provider through [place] do not come directly to the provider, please allow time for these messages to be routed and for me to respond.  ?We get a large volume of MyChart messages daily and these are responded to in the order received.  ? ?For urgent messages, please call the office at [phone] and speak with the front office staff or leave a message on  the line of my assistant for guidance.  ?We are seeing patients from the hours of 8:00 am through 5:00 pm and calls directly to the nurse may not be answered immediately due to seeing patients, but your call will be returned as soon as possible.  ?Phone  messages received after 4:00 PM Monday through Thursday may not be returned until the following business day. Phone messages received after 11:00 AM on Friday may not be returned until Monday.  ? ?After Hours ?We share on call hours with providers from other offices. If you have an urgent need after hours that cannot wait until the next business day, please contact the on call provider by calling the office number. A nurse will speak with you and contact the provider if needed for  recommendations.  ?If you have an urgent or life threatening emergency after hours, please do not call the on call provider, but seek emergency evaluation by calling 911 or going to the nearest emergency room for evaluation.  ? ?Paperwork ?All paperwork requires a minimum of 5 days to complete and return to you or the designated personnel. Please keep this in mind when bringing in forms or sending requests for paperwork completion to the office.  ?  ?

## 2021-05-15 NOTE — Progress Notes (Signed)
?Orma Render, DNP, AGNP-c ?Primary Care & Sports Medicine ?[place], [place] [address] ?[phone] ? ?Subjective:  ? ?Kelli Moore is a 61 y.o. female presents to day for continuation of severe sore throat and right-sided ear and neck pain.  [name] has been seen several times for this issue and has been treated with several rounds of antibiotics and steroids.  Initially treatment started for suspected sinusitis with Augmentin however this treatment option failed and her symptoms continued.  At her follow-up visit strep test was positive and she was started on cefdinir and prednisone burst.  Symptoms did not improve within 3 days of starting the second antibiotic and she was subsequently provided with a IM penicillin injection on Friday of last week.  Today she presents with continued severe sore throat.  At this time etiology is unclear.  Patient and husband do report that in February of this year she did have a tick that was found on the back of her neck and this was removed.  They are concerned that the symptoms could be resultant of that.  At this time she is not having any known fevers but does still have ongoing cough and congestion symptoms.  She is also having generalized malaise and is not sleeping well.  She is not having any shortness of breath or difficulty breathing, productive cough, chest pain, palpitations, dizziness, rash. ? ?PMH, Medications, and Allergies reviewed and updated in chart.  ? ? ?Objective:  ?LMP 09/14/2010  ?ROS negative except for what is listed in HPI. ? ?Physical Exam ?Constitutional:   ?   Appearance: She is ill-appearing.  ?HENT:  ?   Head: Normocephalic.  ?   Ears:  ?   Comments: Bilateral clear effusion with bulging of the right TM present. No erythema or edema noted.  ?   Nose: Congestion present.  ?   Mouth/Throat:  ?   Mouth: Mucous membranes are moist.  ?   Pharynx: Oropharynx is clear. Posterior oropharyngeal erythema  present.  ?Eyes:  ?   Extraocular Movements: Extraocular movements intact.  ?   Conjunctiva/sclera: Conjunctivae normal.  ?   Pupils: Pupils are equal, round, and reactive to light.  ?Neck:  ?   Vascular: No carotid bruit.  ?Cardiovascular:  ?   Rate and Rhythm: Normal rate and regular rhythm.  ?   Pulses: Normal pulses.  ?   Heart sounds: Normal heart sounds.  ?Pulmonary:  ?   Effort: Pulmonary effort is normal.  ?   Breath sounds: Normal breath sounds. No wheezing.  ?Musculoskeletal:     ?   General: Normal range of motion.  ?   Cervical back: Tenderness present.  ?   Right lower leg: No edema.  ?   Left lower leg: No edema.  ?Lymphadenopathy:  ?   Cervical: Cervical adenopathy present.  ?Skin: ?   General: Skin is warm and dry.  ?   Capillary Refill: Capillary refill takes less than 2 seconds.  ?Neurological:  ?   General: No focal deficit present.  ?   Mental Status: She is alert and oriented to person, place, and time.  ?   Motor: No weakness.  ?Psychiatric:     ?   Mood and Affect: Mood normal.     ?   Behavior: Behavior normal.     ?   Thought Content: Thought content normal.     ?   Judgment: Judgment normal.  ? ? ? ?    ? ?  Assessment & Plan:  ? ?Sore throat ?Ongoing severe sore throat of unknown etiology.  Patient has tried and failed multiple antibiotics as well as steroid burst for treatment.  Sore throat is the primary concern at visit today.  Recent strep test was positive however treatment x3 has been provided.  Suspect viral etiology of symptoms as they are not currently responding to antibiotic treatment.  We will provide dexamethasone injection today to help with inflammation and pain in the throat.  No signs of oral thrush present.  Unclear at this time if recent tick bite could be a contributing factor therefore we will obtain labs today to rule out tickborne illness as well as mononucleosis.  Discussed with patient in the setting of ongoing symptoms and multiple treatment failures we will send  referral to ENT for further evaluation.  No alarm symptoms present at this time.  Patient will follow-up if symptoms worsen or fail to improve ? ? ?History and Medication reviewed and updated this encounter.  ? ?Orma Render, DNP, AGNP-c ?05/15/2021  6:22 PM   ? ?

## 2021-05-15 NOTE — Assessment & Plan Note (Signed)
Ongoing severe sore throat of unknown etiology.  Patient has tried and failed multiple antibiotics as well as steroid burst for treatment.  Sore throat is the primary concern at visit today.  Recent strep test was positive however treatment x3 has been provided.  Suspect viral etiology of symptoms as they are not currently responding to antibiotic treatment.  We will provide dexamethasone injection today to help with inflammation and pain in the throat.  No signs of oral thrush present.  Unclear at this time if recent tick bite could be a contributing factor therefore we will obtain labs today to rule out tickborne illness as well as mononucleosis.  Discussed with patient in the setting of ongoing symptoms and multiple treatment failures we will send referral to ENT for further evaluation.  No alarm symptoms present at this time.  Patient will follow-up if symptoms worsen or fail to improve ?

## 2021-05-17 LAB — COMPREHENSIVE METABOLIC PANEL
ALT: 16 IU/L (ref 0–32)
AST: 17 IU/L (ref 0–40)
Albumin/Globulin Ratio: 1.7 (ref 1.2–2.2)
Albumin: 4.6 g/dL (ref 3.8–4.9)
Alkaline Phosphatase: 99 IU/L (ref 44–121)
BUN/Creatinine Ratio: 12 (ref 9–23)
BUN: 11 mg/dL (ref 6–24)
Bilirubin Total: 0.3 mg/dL (ref 0.0–1.2)
CO2: 23 mmol/L (ref 20–29)
Calcium: 9.7 mg/dL (ref 8.7–10.2)
Chloride: 100 mmol/L (ref 96–106)
Creatinine, Ser: 0.89 mg/dL (ref 0.57–1.00)
Globulin, Total: 2.7 g/dL (ref 1.5–4.5)
Glucose: 89 mg/dL (ref 70–99)
Potassium: 5 mmol/L (ref 3.5–5.2)
Sodium: 140 mmol/L (ref 134–144)
Total Protein: 7.3 g/dL (ref 6.0–8.5)
eGFR: 75 mL/min/{1.73_m2} (ref >59)

## 2021-05-17 LAB — CBC WITH DIFFERENTIAL/PLATELET
Basophils Absolute: 0.1 10*3/uL (ref 0.0–0.2)
Basos: 1 %
EOS (ABSOLUTE): 0.2 10*3/uL (ref 0.0–0.4)
Eos: 2 %
Hematocrit: 38 % (ref 34.0–46.6)
Hemoglobin: 13 g/dL (ref 11.1–15.9)
Immature Grans (Abs): 0.1 10*3/uL (ref 0.0–0.1)
Immature Granulocytes: 1 %
Lymphocytes Absolute: 1.8 10*3/uL (ref 0.7–3.1)
Lymphs: 15 %
MCH: 31.6 pg (ref 26.6–33.0)
MCHC: 34.2 g/dL (ref 31.5–35.7)
MCV: 93 fL (ref 79–97)
Monocytes Absolute: 1 10*3/uL — ABNORMAL HIGH (ref 0.1–0.9)
Monocytes: 9 %
Neutrophils Absolute: 8.3 10*3/uL — ABNORMAL HIGH (ref 1.4–7.0)
Neutrophils: 72 %
Platelets: 368 10*3/uL (ref 150–450)
RBC: 4.11 x10E6/uL (ref 3.77–5.28)
RDW: 12 % (ref 11.7–15.4)
WBC: 11.5 10*3/uL — ABNORMAL HIGH (ref 3.4–10.8)

## 2021-05-17 LAB — EBV AB TO VIRAL CAPSID AG PNL, IGG+IGM
EBV VCA IgG: 347 U/mL — ABNORMAL HIGH (ref 0.0–17.9)
EBV VCA IgM: <36 U/mL (ref 0.0–35.9)

## 2021-05-17 NOTE — Assessment & Plan Note (Signed)
Positive strep test in office today. Patient has completed antibiotic therapy, but this has not been effective in resolving her symptoms. We will start new antibiotic therapy today and start oral steroids for inflammation of the throat and ears. Recommend close monitoring for symptoms and report if symptoms worsen or fail to improve immediately.  ?

## 2021-05-18 ENCOUNTER — Encounter (HOSPITAL_BASED_OUTPATIENT_CLINIC_OR_DEPARTMENT_OTHER): Payer: Self-pay | Admitting: Nurse Practitioner

## 2021-05-26 NOTE — Progress Notes (Signed)
?  Orma Render, DNP, AGNP-c ?Primary Care & Sports Medicine ?[facility] [place], [place] [address] ?[phone] ? ?Subjective:  ? ?Kelli Moore is a 61 y.o. female presents to day for penicillin injection for strep throat.  ?Initially a nurse visit, but patient requested to see provider.  ?Strep throat ?Kelli Moore endorses ongoing severe sore throat despite oral antibiotic th therapy and steroid injections.  She has been taking her medications as prescribed however her symptoms are not improving at all.  She is also experiencing increased fatigue and fevers ? ?PMH, Medications, and Allergies reviewed and updated in chart.  ? ? ?Objective:  ?LMP 09/14/2010  ?ROS negative except for what is listed in HPI. ?Physical Exam ?Vitals and nursing note reviewed.  ?Constitutional:   ?   Appearance: She is ill-appearing.  ?HENT:  ?   Head: Normocephalic.  ?   Nose: Congestion present.  ?   Mouth/Throat:  ?   Pharynx: Oropharyngeal exudate and posterior oropharyngeal erythema present.  ?Eyes:  ?   Extraocular Movements: Extraocular movements intact.  ?   Conjunctiva/sclera: Conjunctivae normal.  ?   Pupils: Pupils are equal, round, and reactive to light.  ?Cardiovascular:  ?   Rate and Rhythm: Regular rhythm. Tachycardia present.  ?   Pulses: Normal pulses.  ?   Heart sounds: Normal heart sounds.  ?Pulmonary:  ?   Effort: Pulmonary effort is normal.  ?   Breath sounds: Normal breath sounds.  ?Abdominal:  ?   General: Abdomen is flat. There is no distension.  ?   Palpations: Abdomen is soft. There is no mass.  ?   Tenderness: There is no abdominal tenderness. There is no guarding or rebound.  ?Musculoskeletal:  ?   Right lower leg: No edema.  ?   Left lower leg: No edema.  ?Lymphadenopathy:  ?   Cervical: Cervical adenopathy present.  ?Skin: ?   General: Skin is warm and dry.  ?   Capillary Refill: Capillary refill takes less than 2 seconds.  ?   Comments: Febrile  ?Neurological:  ?   General:  No focal deficit present.  ?   Mental Status: She is alert and oriented to person, place, and time.  ?   Motor: No weakness.  ?   Gait: Gait normal.  ?Psychiatric:     ?   Mood and Affect: Mood normal.     ?   Behavior: Behavior normal.     ?   Thought Content: Thought content normal.     ?   Judgment: Judgment normal.  ? ? ?    ? ?Assessment & Plan:  ? ?Sore throat ?Evaluation of patient in the office today for continuation of symptoms of strep throat despite oral antibiotic treatment.  We will provide additional treatment with injectable penicillin to see if this will help relieve some of her symptoms.  Recommend increase hydration and rest as well as antipyretics for management of fever.  If symptoms have not improved by Monday we will plan on further evaluation and lab work.  We will plan to touch base with patient Kelli Moore next week or she may contact the office at any time if symptoms do not improve. ? ? ?History and Medication reviewed and updated this encounter.  ? ?Orma Render, DNP, AGNP-c ?05/26/2021  5:08 PM   ? ?

## 2021-05-26 NOTE — Assessment & Plan Note (Signed)
Evaluation of patient in the office today for continuation of symptoms of strep throat despite oral antibiotic treatment.  We will provide additional treatment with injectable penicillin to see if this will help relieve some of her symptoms.  Recommend increase hydration and rest as well as antipyretics for management of fever.  If symptoms have not improved by Monday we will plan on further evaluation and lab work.  We will plan to touch base with patient Kelli Moore next week or she may contact the office at any time if symptoms do not improve. ?

## 2021-06-07 ENCOUNTER — Other Ambulatory Visit: Payer: Self-pay

## 2021-06-07 DIAGNOSIS — E7800 Pure hypercholesterolemia, unspecified: Secondary | ICD-10-CM

## 2021-06-07 NOTE — Progress Notes (Signed)
Needs Ca Score CT ordered. ?

## 2021-06-29 ENCOUNTER — Ambulatory Visit (HOSPITAL_BASED_OUTPATIENT_CLINIC_OR_DEPARTMENT_OTHER)
Admission: RE | Admit: 2021-06-29 | Discharge: 2021-06-29 | Disposition: A | Discharge status: Home / Self Care | Payer: 59 | Source: Ambulatory Visit | Attending: Cardiovascular Disease | Admitting: Cardiovascular Disease

## 2021-06-29 DIAGNOSIS — E7800 Pure hypercholesterolemia, unspecified: Secondary | ICD-10-CM | POA: Insufficient documentation

## 2021-09-07 ENCOUNTER — Inpatient Hospital Stay: Admission: RE | Admit: 2021-09-07 | Payer: 59 | Source: Ambulatory Visit

## 2021-11-07 DIAGNOSIS — Z08 Encounter for follow-up examination after completed treatment for malignant neoplasm: Secondary | ICD-10-CM | POA: Diagnosis not present

## 2021-11-07 DIAGNOSIS — D1801 Hemangioma of skin and subcutaneous tissue: Secondary | ICD-10-CM | POA: Diagnosis not present

## 2021-11-07 DIAGNOSIS — L570 Actinic keratosis: Secondary | ICD-10-CM | POA: Diagnosis not present

## 2021-11-07 DIAGNOSIS — D2239 Melanocytic nevi of other parts of face: Secondary | ICD-10-CM | POA: Diagnosis not present

## 2021-11-07 DIAGNOSIS — Z85828 Personal history of other malignant neoplasm of skin: Secondary | ICD-10-CM | POA: Diagnosis not present

## 2021-11-07 DIAGNOSIS — L814 Other melanin hyperpigmentation: Secondary | ICD-10-CM | POA: Diagnosis not present

## 2021-11-07 DIAGNOSIS — D485 Neoplasm of uncertain behavior of skin: Secondary | ICD-10-CM | POA: Diagnosis not present

## 2021-12-07 NOTE — Progress Notes (Unsigned)
[facility] [address] [place] Phone: [phone] Subjective:    I'm seeing this patient by the request  of:  No primary care provider on file.  CC: low back pain   [id number]  Kelli Moore is a 61 y.o. female coming in with complaint of LBP ongoing for years, worsening pain over the last 2 months. Patient states she had a MRI years ago and reports a hx of DDD at L3-4. Pt locates pain to the midline and R-side of her low back w/ radiating pain along the posterior aspect of the R leg. Pt will also experience numbness in the 4th-5th toes.  Patient states that 2 weeks ago was significantly worse but now has slowly gotten a little better.  Patient though states that it is still affecting her not able to actually start to increase activity at the moment.     Past Medical History:  Diagnosis Date   Anxiety    Basal cell carcinoma 04/28/2002   Right Inner Breast (curet and 5FU)   Depression    GERD (gastroesophageal reflux disease)    Mass of right breast 02/2017   Past Surgical History:  Procedure Laterality Date   ABDOMINAL SURGERY  2020   LIPOSUCTION   BREAST BIOPSY Right 02/08/2017   Stereotactic biopsy- Fibrocystic changes with calcs   BREAST EXCISIONAL BIOPSY Right 03/15/2017   BREAST LUMPECTOMY WITH RADIOACTIVE SEED LOCALIZATION Right 03/15/2017   Procedure: RIGHT BREAST LUMPECTOMY WITH RADIOACTIVE SEED LOCALIZATION ERAS PATHWAY;  Surgeon: Jovita Kussmaul, MD;  Location: [facility];  Service: General;  Laterality: Right;   BUNIONECTOMY Right 2016   CARPAL TUNNEL RELEASE Right 11/16/2014   Procedure: CARPAL TUNNEL RELEASE;  Surgeon: Daryll Brod, MD;  Location: [facility];  Service: Orthopedics;  Laterality: Right;   DIAGNOSTIC LAPAROSCOPY     for pregnancy   METATARSAL OSTEOTOMY WITH BUNIONECTOMY Left 01/29/2017   TRIGGER FINGER RELEASE Right 11/16/2014   Procedure: RELEASE TRIGGER FINGER/A-1  PULLEY;  Surgeon: Daryll Brod, MD;  Location: [facility];  Service: Orthopedics;  Laterality: Right;   Social History   Socioeconomic History   Marital status: Married    Spouse name: Not on file   Number of children: Not on file   Years of education: Not on file   Highest education level: Not on file  Occupational History   Not on file  Tobacco Use   Smoking status: Never   Smokeless tobacco: Never  Vaping Use   Vaping Use: Never used  Substance and Sexual Activity   Alcohol use: Yes    Comment: occasionally   Drug use: No   Sexual activity: Yes    Partners: Male    Birth control/protection: Post-menopausal    Comment: 1st intercourse- 42, partners- 11, married- 18.5 yrs   Other Topics Concern   Not on file  Social History Narrative   Not on file   Social Determinants of Health   Financial Resource Strain: Not on file  Food Insecurity: Not on file  Transportation Needs: Not on file  Physical Activity: Not on file  Stress: Not on file  Social Connections: Not on file   No Known Allergies Family History  Problem Relation Age of Onset   Stroke Maternal Grandfather    Hypertension Father    Cancer Mother        colon   Cancer Maternal Aunt        ovarian  Cancer Maternal Uncle        liver   Cancer Maternal Uncle        liver   Breast cancer Neg Hx     Current Outpatient Medications (Endocrine & Metabolic):    predniSONE (DELTASONE) 20 MG tablet, Take 1 tablet (20 mg total) by mouth 2 (two) times daily.     Current Outpatient Medications (Respiratory):    azelastine (ASTELIN) 0.1 % nasal spray, Place 2 sprays into both nostrils 2 (two) times daily. Use in each nostril as directed   fluticasone (FLONASE) 50 MCG/ACT nasal spray, Place into the nose.       Current Outpatient Medications (Other):    gabapentin (NEURONTIN) 100 MG capsule, Take 2 capsules (200 mg total) by mouth at bedtime.   CLENPIQ 10-3.5-12 MG-GM -GM/160ML SOLN, Take by  mouth as directed.   doxylamine, Sleep, (UNISOM) 25 MG tablet, Take 25 mg by mouth at bedtime as needed.   estradiol (ESTRACE VAGINAL) 0.1 MG/GM vaginal cream, Insert 1 gram intravaginally twice weekly.   FLUoxetine (PROZAC) 20 MG capsule, Take 1 capsule (20 mg total) by mouth daily.   Multiple Vitamin (MULTIVITAMIN) tablet, Take 1 tablet by mouth daily.   Probiotic Product (PROBIOTIC ADVANCED PO), Take by mouth.   triamcinolone cream (KENALOG) 0.1 %, Apply 1 application topically daily.   XIIDRA 5 % SOLN, Apply 1 drop to eye 2 (two) times daily.   zolpidem (AMBIEN) 10 MG tablet, Take 1 tablet (10 mg total) by mouth at bedtime as needed for sleep.  Current Facility-Administered Medications (Other):    Penicillin G Benzathine SUSP 2,400,000 Units   Reviewed prior external information including notes and imaging from  primary care provider As well as notes that were available from care everywhere and other healthcare systems.  Past medical history, social, surgical and family history all reviewed in electronic medical record.  No pertanent information unless stated regarding to the chief complaint.   Review of Systems:  No headache, visual changes, nausea, vomiting, diarrhea, constipation, dizziness, abdominal pain, skin rash, fevers, chills, night sweats, weight loss, swollen lymph nodes, body aches, joint swelling, chest pain, shortness of breath, mood changes. POSITIVE muscle aches  Objective  Blood pressure 128/82, pulse 83, height '5\' 4"'$  (1.626 m), weight 157 lb (71.2 kg), last menstrual period 09/14/2010, SpO2 97 %.   General: No apparent distress alert and oriented x3 mood and affect normal, dressed appropriately.  HEENT: Pupils equal, extraocular movements intact  Respiratory: Patient's speak in full sentences and does not appear short of breath  Cardiovascular: No lower extremity edema, non tender, no erythema  Low back exam does have some mild loss of lordosis.  Positive straight  leg test noted on the right side.  Patient may have some mild weakness with 4 out of 5 strength with dorsiflexion of the foot compared to the contralateral side.  Deep tendon reflexes are intact.   97110; 15 additional minutes spent for Therapeutic exercises as stated in above notes.  This included exercises focusing on stretching, strengthening, with significant focus on eccentric aspects.   Long term goals include an improvement in range of motion, strength, endurance as well as avoiding reinjury. Patient's frequency would include in 1-2 times a day, 3-5 times a week for a duration of 6-12 weeks. Low back exercises that included:  Pelvic tilt/bracing instruction to focus on control of the pelvic girdle and lower abdominal muscles  Glute strengthening exercises, focusing on proper firing of the glutes without engaging the  low back muscles Proper stretching techniques for maximum relief for the hamstrings, hip flexors, low back and some rotation where tolerated  Proper technique shown and discussed handout in great detail with ATC.  All questions were discussed and answered. \   Impression and Recommendations:     The above documentation has been reviewed and is accurate and complete [name], DO

## 2021-12-08 ENCOUNTER — Ambulatory Visit (INDEPENDENT_AMBULATORY_CARE_PROVIDER_SITE_OTHER): Payer: 59

## 2021-12-08 ENCOUNTER — Ambulatory Visit (INDEPENDENT_AMBULATORY_CARE_PROVIDER_SITE_OTHER): Payer: 59 | Admitting: Family Medicine

## 2021-12-08 VITALS — BP 128/82 | HR 83 | Ht 64.0 in | Wt 157.0 lb

## 2021-12-08 DIAGNOSIS — M5416 Radiculopathy, lumbar region: Secondary | ICD-10-CM

## 2021-12-08 DIAGNOSIS — M545 Low back pain, unspecified: Secondary | ICD-10-CM | POA: Diagnosis not present

## 2021-12-08 DIAGNOSIS — G8929 Other chronic pain: Secondary | ICD-10-CM

## 2021-12-08 DIAGNOSIS — M5441 Lumbago with sciatica, right side: Secondary | ICD-10-CM

## 2021-12-08 MED ORDER — PREDNISONE 20 MG PO TABS: 20.0000 mg | ORAL_TABLET | Freq: Two times a day (BID) | ORAL | 0 refills | Status: DC

## 2021-12-08 MED ORDER — GABAPENTIN 100 MG PO CAPS: 200.0000 mg | ORAL_CAPSULE | Freq: Every day | ORAL | 3 refills | Status: DC

## 2021-12-08 NOTE — Patient Instructions (Addendum)
Thank you for coming in today.   Please get an Xray today before you leave   I've sent a prescription for Prednisone & Gabapentin to your pharmacy.   Avoid repetitive trunk flexion activities.  Please complete the exercises that the athletic trainer went over with you:  View at [email or web address] using code: [id number]  Please complete the exercises that the athletic trainer went over with you:    Ice 20 minutes 2 times daily. Usually after activity and before bed.   See me again in 5 weeks.

## 2021-12-09 DIAGNOSIS — M5416 Radiculopathy, lumbar region: Secondary | ICD-10-CM | POA: Insufficient documentation

## 2021-12-09 NOTE — Assessment & Plan Note (Signed)
New problem, acute onset.  Within the last 4 weeks.  Patient may even have some mild weakness of the foot.  Prednisone and gabapentin given.  Patient told to hold on any other anti-inflammatories.  Home exercises given as well.  Discussed being active but watching heavy lifting or repetitive flexion and extension exercises.  Follow-up again in 4 weeks.  Worsening pain patient knows to seek medical attention or see [place] sooner.

## 2021-12-11 NOTE — Progress Notes (Signed)
Lumbar spine x-ray shows some arthritis changes.

## 2021-12-29 ENCOUNTER — Other Ambulatory Visit (HOSPITAL_BASED_OUTPATIENT_CLINIC_OR_DEPARTMENT_OTHER): Payer: Self-pay | Admitting: Nurse Practitioner

## 2022-01-11 NOTE — Progress Notes (Signed)
Kelli Moore Phone: [phone] Subjective:    I'm seeing this patient by the request  of:  [name], Kelli Reveal, MD  CC: low back pain   ERD:[id number]  12/08/2021 New problem, acute onset.  Within the last 4 weeks.  Patient may even have some mild weakness of the foot.  Prednisone and gabapentin given.  Patient told to hold on any other anti-inflammatories.  Home exercises given as well.  Discussed being active but watching heavy lifting or repetitive flexion and extension exercises.  Follow-up again in 4 weeks.  Worsening pain patient knows to seek medical attention or see [place] sooner.     Update 01/19/2022 Kelli Moore is a 61 y.o. female coming in with complaint of LBP. Patient states that she is about the same      Past Medical History:  Diagnosis Date   Anxiety    Basal cell carcinoma 04/28/2002   Right Inner Breast (curet and 5FU)   Depression    GERD (gastroesophageal reflux disease)    Mass of right breast 02/2017   Past Surgical History:  Procedure Laterality Date   ABDOMINAL SURGERY  2020   LIPOSUCTION   BREAST BIOPSY Right 02/08/2017   Stereotactic biopsy- Fibrocystic changes with calcs   BREAST EXCISIONAL BIOPSY Right 03/15/2017   BREAST LUMPECTOMY WITH RADIOACTIVE SEED LOCALIZATION Right 03/15/2017   Procedure: RIGHT BREAST LUMPECTOMY WITH RADIOACTIVE SEED LOCALIZATION ERAS PATHWAY;  Surgeon: Kelli Kussmaul, MD;  Location: [facility];  Service: General;  Laterality: Right;   BUNIONECTOMY Right 2016   CARPAL TUNNEL RELEASE Right 11/16/2014   Procedure: CARPAL TUNNEL RELEASE;  Surgeon: Kelli Brod, MD;  Location: [facility];  Service: Orthopedics;  Laterality: Right;   DIAGNOSTIC LAPAROSCOPY     for pregnancy   METATARSAL OSTEOTOMY WITH BUNIONECTOMY Left 01/29/2017   TRIGGER FINGER RELEASE Right 11/16/2014   Procedure: RELEASE TRIGGER FINGER/A-1 Moore;  Surgeon:  Kelli Brod, MD;  Location: [facility];  Service: Orthopedics;  Laterality: Right;   Social History   Socioeconomic History   Marital status: Married    Spouse name: Not on file   Number of children: Not on file   Years of education: Not on file   Highest education level: Not on file  Occupational History   Not on file  Tobacco Use   Smoking status: Never   Smokeless tobacco: Never  Vaping Use   Vaping Use: Never used  Substance and Sexual Activity   Alcohol use: Yes    Comment: occasionally   Drug use: No   Sexual activity: Yes    Partners: Male    Birth control/protection: Post-menopausal    Comment: 1st intercourse- 89, partners- 36, married- 18.5 yrs   Other Topics Concern   Not on file  Social History Narrative   Not on file   Social Determinants of Health   Financial Resource Strain: Not on file  Food Insecurity: Not on file  Transportation Needs: Not on file  Physical Activity: Not on file  Stress: Not on file  Social Connections: Not on file   No Known Allergies Family History  Problem Relation Age of Onset   Stroke Maternal Grandfather    Hypertension Father    Cancer Mother        colon   Cancer Maternal Aunt        ovarian   Cancer Maternal Uncle  liver   Cancer Maternal Uncle        liver   Breast cancer Neg Hx     Current Outpatient Medications (Endocrine & Metabolic):    predniSONE (DELTASONE) 20 MG tablet, Take 1 tablet (20 mg total) by mouth 2 (two) times daily.     Current Outpatient Medications (Respiratory):    azelastine (ASTELIN) 0.1 % nasal spray, Place 2 sprays into both nostrils 2 (two) times daily. Use in each nostril as directed   fluticasone (FLONASE) 50 MCG/ACT nasal spray, Place into the nose.       Current Outpatient Medications (Other):    CLENPIQ 10-3.5-12 MG-GM -GM/160ML SOLN, Take by mouth as directed.   doxylamine, Sleep, (UNISOM) 25 MG tablet, Take 25 mg by mouth at bedtime as needed.    estradiol (ESTRACE VAGINAL) 0.1 MG/GM vaginal cream, Insert 1 gram intravaginally twice weekly.   FLUoxetine (PROZAC) 20 MG capsule, Take 1 capsule (20 mg total) by mouth daily.   gabapentin (NEURONTIN) 100 MG capsule, Take 2 capsules (200 mg total) by mouth at bedtime.   Multiple Vitamin (MULTIVITAMIN) tablet, Take 1 tablet by mouth daily.   Probiotic Product (PROBIOTIC ADVANCED PO), Take by mouth.   triamcinolone cream (KENALOG) 0.1 %, Apply 1 application topically daily.   XIIDRA 5 % SOLN, Apply 1 drop to eye 2 (two) times daily.   zolpidem (AMBIEN) 10 MG tablet, Take 1 tablet (10 mg total) by mouth at bedtime as needed for sleep.  Current Facility-Administered Medications (Other):    Penicillin G Benzathine SUSP 2,400,000 Units   Reviewed prior external information including notes and imaging from  primary care provider As well as notes that were available from care everywhere and other healthcare systems.  Past medical history, social, surgical and family history all reviewed in electronic medical record.  No pertanent information unless stated regarding to the chief complaint.   Review of Systems:  No headache, visual changes, nausea, vomiting, diarrhea, constipation, dizziness, abdominal pain, skin rash, fevers, chills, night sweats, weight loss, swollen lymph nodes, body aches, joint swelling, chest pain, shortness of breath, mood changes. POSITIVE muscle aches  Objective  Blood pressure 120/80, pulse 80, height '5\' 4"'$  (1.626 m), weight 158 lb (71.7 kg), last menstrual period 09/14/2010, SpO2 99 %.   General: No apparent distress alert and oriented x3 mood and affect normal, dressed appropriately.  HEENT: Pupils equal, extraocular movements intact  Respiratory: Patient's speak in full sentences and does not appear short of breath  Cardiovascular: No lower extremity edema, non tender, no erythema  Low back exam shows loss of lordosis.  Does have weakness of the hip girdle it  appears.  Also weakness with dorsiflexion of the foot.  Deep tendon reflex does have 1+ on the Achilles on the right compared to 2+ on the left.    Impression and Recommendations:    The above documentation has been reviewed and is accurate and complete Kelli Pulley, DO

## 2022-01-18 ENCOUNTER — Encounter (HOSPITAL_BASED_OUTPATIENT_CLINIC_OR_DEPARTMENT_OTHER): Payer: Self-pay | Admitting: Family Medicine

## 2022-01-18 ENCOUNTER — Ambulatory Visit: Payer: 59 | Admitting: Family Medicine

## 2022-01-18 VITALS — BP 120/80 | HR 80 | Ht 64.0 in | Wt 158.0 lb

## 2022-01-18 DIAGNOSIS — M5441 Lumbago with sciatica, right side: Secondary | ICD-10-CM

## 2022-01-18 DIAGNOSIS — G8929 Other chronic pain: Secondary | ICD-10-CM | POA: Diagnosis not present

## 2022-01-18 DIAGNOSIS — M5416 Radiculopathy, lumbar region: Secondary | ICD-10-CM | POA: Diagnosis not present

## 2022-01-18 NOTE — Assessment & Plan Note (Signed)
Continues to have the radicular symptoms and does have weakness of the right foot still noted.  With this I am concerned that patient does have a nerve root impingement that is contributing significantly.  Will get MRI of the lumbar spine to further evaluate.  Patient has failed other conservative therapy already to including home exercises, gabapentin, prednisone and oral anti-inflammatories.  With the weakness could be advanced imaging is warranted and depending on findings could be a candidate for possible injections.  Will follow-up after the imaging.

## 2022-01-18 NOTE — Patient Instructions (Addendum)
[facility] [place] [facility] [phone] I will contact you about results and next steps

## 2022-01-19 ENCOUNTER — Encounter (HOSPITAL_BASED_OUTPATIENT_CLINIC_OR_DEPARTMENT_OTHER): Payer: 59 | Admitting: Nurse Practitioner

## 2022-01-23 ENCOUNTER — Other Ambulatory Visit (HOSPITAL_BASED_OUTPATIENT_CLINIC_OR_DEPARTMENT_OTHER): Payer: Self-pay | Admitting: Obstetrics & Gynecology

## 2022-01-23 DIAGNOSIS — F3289 Other specified depressive episodes: Secondary | ICD-10-CM

## 2022-01-30 ENCOUNTER — Ambulatory Visit
Admission: RE | Admit: 2022-01-30 | Discharge: 2022-01-30 | Disposition: A | Discharge status: Home / Self Care | Payer: 59 | Source: Ambulatory Visit | Attending: Family Medicine | Admitting: Family Medicine

## 2022-01-30 DIAGNOSIS — M545 Low back pain, unspecified: Secondary | ICD-10-CM | POA: Diagnosis not present

## 2022-01-30 DIAGNOSIS — G8929 Other chronic pain: Secondary | ICD-10-CM

## 2022-01-30 DIAGNOSIS — M4316 Spondylolisthesis, lumbar region: Secondary | ICD-10-CM | POA: Diagnosis not present

## 2022-01-30 DIAGNOSIS — M48061 Spinal stenosis, lumbar region without neurogenic claudication: Secondary | ICD-10-CM | POA: Diagnosis not present

## 2022-01-30 DIAGNOSIS — R200 Anesthesia of skin: Secondary | ICD-10-CM | POA: Diagnosis not present

## 2022-01-30 DIAGNOSIS — M5416 Radiculopathy, lumbar region: Secondary | ICD-10-CM

## 2022-01-30 DIAGNOSIS — M4126 Other idiopathic scoliosis, lumbar region: Secondary | ICD-10-CM | POA: Diagnosis not present

## 2022-01-31 ENCOUNTER — Other Ambulatory Visit: Payer: Self-pay

## 2022-01-31 ENCOUNTER — Telehealth: Payer: Self-pay | Admitting: Family Medicine

## 2022-01-31 DIAGNOSIS — M5416 Radiculopathy, lumbar region: Secondary | ICD-10-CM

## 2022-01-31 NOTE — Telephone Encounter (Signed)
Pt read Dr. Thompson Caul notes on her MRI and would like to proceed with epidural.

## 2022-01-31 NOTE — Telephone Encounter (Signed)
Epidural placed. Patient notified.

## 2022-01-31 NOTE — Telephone Encounter (Signed)
Spoke with patient who will call [facility] to schedule.

## 2022-02-01 ENCOUNTER — Ambulatory Visit (INDEPENDENT_AMBULATORY_CARE_PROVIDER_SITE_OTHER): Payer: 59 | Admitting: Family Medicine

## 2022-02-01 ENCOUNTER — Encounter (HOSPITAL_BASED_OUTPATIENT_CLINIC_OR_DEPARTMENT_OTHER): Payer: Self-pay | Admitting: Family Medicine

## 2022-02-01 ENCOUNTER — Ambulatory Visit (HOSPITAL_BASED_OUTPATIENT_CLINIC_OR_DEPARTMENT_OTHER): Payer: 59

## 2022-02-01 ENCOUNTER — Inpatient Hospital Stay (HOSPITAL_BASED_OUTPATIENT_CLINIC_OR_DEPARTMENT_OTHER): Admission: RE | Admit: 2022-02-01 | Payer: 59 | Source: Ambulatory Visit | Admitting: Radiology

## 2022-02-01 VITALS — BP 142/82 | HR 69 | Temp 97.6°F | Ht 64.0 in | Wt 157.8 lb

## 2022-02-01 DIAGNOSIS — Z0000 Encounter for general adult medical examination without abnormal findings: Secondary | ICD-10-CM | POA: Diagnosis not present

## 2022-02-01 DIAGNOSIS — Z1231 Encounter for screening mammogram for malignant neoplasm of breast: Secondary | ICD-10-CM

## 2022-02-01 MED ORDER — PANTOPRAZOLE SODIUM 40 MG PO TBEC: 40.0000 mg | DELAYED_RELEASE_TABLET | Freq: Every day | ORAL | 2 refills | Status: DC

## 2022-02-01 NOTE — Progress Notes (Signed)
Subjective:    CC: Annual Physical Exam  HPI:  Kelli Moore is a 62 y.o. presenting for annual physical  I reviewed the past medical history, family history, social history, surgical history, and allergies today and no changes were needed.  Please see the problem list section below in epic for further details.  Past Medical History: Past Medical History:  Diagnosis Date   Anxiety    Basal cell carcinoma 04/28/2002   Right Inner Breast (curet and 5FU)   Depression    GERD (gastroesophageal reflux disease)    Mass of right breast 02/2017   Past Surgical History: Past Surgical History:  Procedure Laterality Date   ABDOMINAL SURGERY  2020   LIPOSUCTION   BREAST BIOPSY Right 02/08/2017   Stereotactic biopsy- Fibrocystic changes with calcs   BREAST EXCISIONAL BIOPSY Right 03/15/2017   BREAST LUMPECTOMY WITH RADIOACTIVE SEED LOCALIZATION Right 03/15/2017   Procedure: RIGHT BREAST LUMPECTOMY WITH RADIOACTIVE SEED LOCALIZATION ERAS PATHWAY;  Surgeon: Jovita Kussmaul, MD;  Location: [place];  Service: General;  Laterality: Right;   BUNIONECTOMY Right 2016   CARPAL TUNNEL RELEASE Right 11/16/2014   Procedure: CARPAL TUNNEL RELEASE;  Surgeon: Daryll Brod, MD;  Location: [place];  Service: Orthopedics;  Laterality: Right;   DIAGNOSTIC LAPAROSCOPY     for pregnancy   METATARSAL OSTEOTOMY WITH BUNIONECTOMY Left 01/29/2017   TRIGGER FINGER RELEASE Right 11/16/2014   Procedure: RELEASE TRIGGER FINGER/A-1 PULLEY;  Surgeon: Daryll Brod, MD;  Location: [place];  Service: Orthopedics;  Laterality: Right;   Social History: Social History   Socioeconomic History   Marital status: Married    Spouse name: Not on file   Number of children: Not on file   Years of education: Not on file   Highest education level: Not on file  Occupational History   Not on file  Tobacco Use   Smoking status: Never   Smokeless tobacco: Never  Vaping Use    Vaping Use: Never used  Substance and Sexual Activity   Alcohol use: Yes    Comment: occasionally   Drug use: No   Sexual activity: Yes    Partners: Male    Birth control/protection: Post-menopausal    Comment: 1st intercourse- 32, partners- 74, married- 18.5 yrs   Other Topics Concern   Not on file  Social History Narrative   Not on file   Social Determinants of Health   Financial Resource Strain: Not on file  Food Insecurity: Not on file  Transportation Needs: Not on file  Physical Activity: Not on file  Stress: Not on file  Social Connections: Not on file   Family History: Family History  Problem Relation Age of Onset   Stroke Maternal Grandfather    Hypertension Father    Cancer Mother        colon   Cancer Maternal Aunt        ovarian   Cancer Maternal Uncle        liver   Cancer Maternal Uncle        liver   Breast cancer Neg Hx    Allergies: No Known Allergies Medications: See med rec.  Review of Systems: No headache, visual changes, nausea, vomiting, diarrhea, constipation, dizziness, abdominal pain, skin rash, fevers, chills, night sweats, swollen lymph nodes, weight loss, chest pain, body aches, joint swelling, muscle aches, shortness of breath, mood changes, visual or auditory hallucinations.  Objective:    BP (!) 142/82 (BP Location:  Right Arm, Patient Position: Sitting, Cuff Size: Large)   Pulse 69   Temp 97.6 F (36.4 C) (Temporal)   Ht '5\' 4"'$  (1.626 m)   Wt 157 lb 12.8 oz (71.6 kg)   LMP 09/14/2010   SpO2 99%   BMI 27.09 kg/m   General: Well Developed, well nourished, and in no acute distress.  Neuro: Alert and oriented x3, extra-ocular muscles intact, sensation grossly intact. Cranial nerves II through XII are intact, motor, sensory, and coordinative functions are all intact. HEENT: Normocephalic, atraumatic, pupils equal round reactive to light, neck supple, no masses, no lymphadenopathy, thyroid nonpalpable. Oropharynx, nasopharynx, external  ear canals are unremarkable. Skin: Warm and dry, no rashes noted.  Cardiac: Regular rate and rhythm, no murmurs rubs or gallops.  Respiratory: Clear to auscultation bilaterally. Not using accessory muscles, speaking in full sentences.  Abdominal: Soft, nontender, nondistended, positive bowel sounds, no masses, no organomegaly.  Musculoskeletal: Shoulder, elbow, wrist, hip, knee, ankle stable, and with full range of motion.  Impression and Recommendations:    Wellness examination Routine HCM labs ordered. HCM reviewed/discussed. Anticipatory guidance regarding healthy weight, lifestyle and choices given. Recommend healthy diet.  Recommend approximately 150 minutes/week of moderate intensity exercise Recommend regular dental and vision exams Always use seatbelt/lap and shoulder restraints Recommend using smoke alarms and checking batteries at least twice a year Recommend using sunscreen when outside Discussed colon cancer screening recommendations, options.  Patient is UTD Discussed recommendations for shingles vaccine.  Patient is UTD Discussed tetanus immunization recommendations, patient is UTD  Return in about 4 months (around 06/02/2022).   ___________________________________________ Kelli Baumgardner [name], MD, ABFM, CAQSM [facility] [place]

## 2022-02-01 NOTE — Assessment & Plan Note (Signed)
Routine HCM labs ordered. HCM reviewed/discussed. Anticipatory guidance regarding healthy weight, lifestyle and choices given. Recommend healthy diet.  Recommend approximately 150 minutes/week of moderate intensity exercise Recommend regular dental and vision exams Always use seatbelt/lap and shoulder restraints Recommend using smoke alarms and checking batteries at least twice a year Recommend using sunscreen when outside Discussed colon cancer screening recommendations, options.  Patient is UTD Discussed recommendations for shingles vaccine.  Patient is UTD Discussed tetanus immunization recommendations, patient is UTD

## 2022-02-02 ENCOUNTER — Ambulatory Visit (HOSPITAL_BASED_OUTPATIENT_CLINIC_OR_DEPARTMENT_OTHER): Payer: 59

## 2022-02-02 DIAGNOSIS — Z0000 Encounter for general adult medical examination without abnormal findings: Secondary | ICD-10-CM | POA: Diagnosis not present

## 2022-02-05 ENCOUNTER — Encounter (HOSPITAL_BASED_OUTPATIENT_CLINIC_OR_DEPARTMENT_OTHER): Payer: Self-pay | Admitting: Family Medicine

## 2022-02-06 LAB — LIPID PANEL
Chol/HDL Ratio: 3.1 ratio (ref 0.0–4.4)
Cholesterol, Total: 270 mg/dL — ABNORMAL HIGH (ref 100–199)
HDL: 87 mg/dL (ref >39)
LDL Chol Calc (NIH): 172 mg/dL — ABNORMAL HIGH (ref 0–99)
Triglycerides: 68 mg/dL (ref 0–149)
VLDL Cholesterol Cal: 11 mg/dL (ref 5–40)

## 2022-02-06 LAB — CBC WITH DIFFERENTIAL/PLATELET
Basophils Absolute: 0 10*3/uL (ref 0.0–0.2)
Basos: 1 %
EOS (ABSOLUTE): 0.1 10*3/uL (ref 0.0–0.4)
Eos: 3 %
Hematocrit: 39.9 % (ref 34.0–46.6)
Hemoglobin: 13.2 g/dL (ref 11.1–15.9)
Immature Grans (Abs): 0 10*3/uL (ref 0.0–0.1)
Immature Granulocytes: 0 %
Lymphocytes Absolute: 1.5 10*3/uL (ref 0.7–3.1)
Lymphs: 41 %
MCH: 30.6 pg (ref 26.6–33.0)
MCHC: 33.1 g/dL (ref 31.5–35.7)
MCV: 92 fL (ref 79–97)
Monocytes Absolute: 0.5 10*3/uL (ref 0.1–0.9)
Monocytes: 14 %
Neutrophils Absolute: 1.4 10*3/uL (ref 1.4–7.0)
Neutrophils: 41 %
Platelets: 317 10*3/uL (ref 150–450)
RBC: 4.32 x10E6/uL (ref 3.77–5.28)
RDW: 11.5 % — ABNORMAL LOW (ref 11.7–15.4)
WBC: 3.5 10*3/uL (ref 3.4–10.8)

## 2022-02-06 LAB — COMPREHENSIVE METABOLIC PANEL
ALT: 15 IU/L (ref 0–32)
AST: 20 IU/L (ref 0–40)
Albumin/Globulin Ratio: 1.8 (ref 1.2–2.2)
Albumin: 4.6 g/dL (ref 3.8–4.9)
Alkaline Phosphatase: 78 IU/L (ref 44–121)
BUN/Creatinine Ratio: 23 (ref 9–23)
BUN: 18 mg/dL (ref 6–24)
Bilirubin Total: 0.4 mg/dL (ref 0.0–1.2)
CO2: 22 mmol/L (ref 20–29)
Calcium: 9.5 mg/dL (ref 8.7–10.2)
Chloride: 100 mmol/L (ref 96–106)
Creatinine, Ser: 0.79 mg/dL (ref 0.57–1.00)
Globulin, Total: 2.6 g/dL (ref 1.5–4.5)
Glucose: 89 mg/dL (ref 70–99)
Potassium: 4.2 mmol/L (ref 3.5–5.2)
Sodium: 139 mmol/L (ref 134–144)
Total Protein: 7.2 g/dL (ref 6.0–8.5)
eGFR: 86 mL/min/{1.73_m2} (ref >59)

## 2022-02-06 LAB — HEMOGLOBIN A1C
Est. average glucose Bld gHb Est-mCnc: 108 mg/dL
Hgb A1c MFr Bld: 5.4 % (ref 4.8–5.6)

## 2022-02-06 LAB — TSH RFX ON ABNORMAL TO FREE T4: TSH: 0.901 u[IU]/mL (ref 0.450–4.500)

## 2022-02-07 ENCOUNTER — Other Ambulatory Visit (HOSPITAL_BASED_OUTPATIENT_CLINIC_OR_DEPARTMENT_OTHER): Payer: Self-pay | Admitting: Family Medicine

## 2022-02-09 ENCOUNTER — Encounter (HOSPITAL_BASED_OUTPATIENT_CLINIC_OR_DEPARTMENT_OTHER): Payer: Self-pay | Admitting: Medical

## 2022-02-09 ENCOUNTER — Ambulatory Visit (INDEPENDENT_AMBULATORY_CARE_PROVIDER_SITE_OTHER): Payer: 59 | Admitting: Medical

## 2022-02-09 VITALS — BP 133/84 | HR 87 | Ht 63.75 in | Wt 155.0 lb

## 2022-02-09 DIAGNOSIS — R69 Illness, unspecified: Secondary | ICD-10-CM | POA: Diagnosis not present

## 2022-02-09 DIAGNOSIS — Z01419 Encounter for gynecological examination (general) (routine) without abnormal findings: Secondary | ICD-10-CM | POA: Diagnosis not present

## 2022-02-09 DIAGNOSIS — Z780 Asymptomatic menopausal state: Secondary | ICD-10-CM | POA: Diagnosis not present

## 2022-02-09 DIAGNOSIS — F3289 Other specified depressive episodes: Secondary | ICD-10-CM

## 2022-02-09 MED ORDER — ESTRADIOL 0.1 MG/GM VA CREA: TOPICAL_CREAM | VAGINAL | 3 refills | Status: DC

## 2022-02-09 MED ORDER — FLUOXETINE HCL 40 MG PO CAPS: 40.0000 mg | ORAL_CAPSULE | Freq: Every day | ORAL | 3 refills | Status: DC

## 2022-02-09 NOTE — Progress Notes (Signed)
History:  [name] is a 62 y.o. [id number] who presents to clinic today for annual exam. Patient had normal pap smear 12/2020.  She is on Estradiol cream twice weekly and her symptoms are well-controlled. She denies vaginal bleeding, discharge, pain, N/V/D, UTI symptoms or breast concerns. She has noted scant bleeding with wiping after a bowl movement and associated rectal itching and discomfort. BM have been harder to pass.   The following portions of the patient's history were reviewed and updated as appropriate: allergies, current medications, family history, past medical history, social history, past surgical history and problem list.  Review of Systems:  Review of Systems  Constitutional:  Negative for fever and malaise/fatigue.  Gastrointestinal:  Positive for constipation. Negative for abdominal pain, diarrhea, nausea and vomiting.  Genitourinary:  Negative for dysuria, frequency and urgency.       Neg - vaginal bleeding, discharge, pelvic pain     Objective:  Physical Exam BP 133/84   Pulse 87   Ht 5' 3.75" (1.619 m)   Wt 155 lb (70.3 kg)   LMP 09/14/2010   BMI 26.81 kg/m  Physical Exam Vitals and nursing note reviewed. Exam conducted with a chaperone present.  Constitutional:      General: She is not in acute distress.    Appearance: Normal appearance. She is well-developed and normal weight.  HENT:     Head: Normocephalic and atraumatic.  Neck:     Thyroid: No thyromegaly.  Cardiovascular:     Rate and Rhythm: Normal rate and regular rhythm.     Heart sounds: No murmur heard. Pulmonary:     Effort: Pulmonary effort is normal. No respiratory distress.     Breath sounds: Normal breath sounds. No wheezing.  Abdominal:     General: Abdomen is flat. Bowel sounds are normal. There is no distension.     Palpations: Abdomen is soft. There is no mass.     Tenderness: There is no abdominal tenderness. There is no guarding or rebound.  Genitourinary:    General:  Normal vulva.     Vagina: No vaginal discharge, erythema, tenderness or bleeding.     Cervix: No cervical motion tenderness, discharge, friability, lesion, erythema or cervical bleeding.     Uterus: Not enlarged and not tender.      Adnexa:        Right: No mass or tenderness.         Left: No mass or tenderness.       Rectum: External hemorrhoid (small, non-thrombosed, no bleeding) present.  Musculoskeletal:     Cervical back: Neck supple.  Skin:    General: Skin is warm and dry.     Findings: No erythema.  Neurological:     Mental Status: She is alert and oriented to person, place, and time.  Psychiatric:        Mood and Affect: Mood normal.    Health Maintenance Due  Topic Date Due   COVID-19 Vaccine (4 - 2023-24 season) 09/22/2021    Labs, imaging and previous visits in Epic reviewed  Assessment & Plan:  1. Well woman exam with routine gynecological exam - Next pap smear due 12/2023  2. Other depression - FLUoxetine (PROZAC) 40 MG capsule; Take 1 capsule (40 mg total) by mouth daily.  Dispense: 90 capsule; Refill: 3  3. Postmenopausal - estradiol (ESTRACE VAGINAL) 0.1 MG/GM vaginal cream; Insert 1 gram intravaginally twice weekly.  Dispense: 127.5 g; Refill: 3  Return to [facility] in  1 year for annual exam or sooner PRN   Danielle Rankin 02/09/2022 11:52 AM

## 2022-02-12 ENCOUNTER — Ambulatory Visit
Admission: RE | Admit: 2022-02-12 | Discharge: 2022-02-12 | Disposition: A | Discharge status: Home / Self Care | Payer: 59 | Source: Ambulatory Visit | Attending: Family Medicine | Admitting: Family Medicine

## 2022-02-12 DIAGNOSIS — M5416 Radiculopathy, lumbar region: Secondary | ICD-10-CM

## 2022-02-12 DIAGNOSIS — M4727 Other spondylosis with radiculopathy, lumbosacral region: Secondary | ICD-10-CM | POA: Diagnosis not present

## 2022-02-12 MED ORDER — METHYLPREDNISOLONE ACETATE 40 MG/ML INJ SUSP (RADIOLOG
80.0000 mg | Freq: Once | INTRAMUSCULAR | Status: AC
  Administered 2022-02-12: 80 mg via EPIDURAL

## 2022-02-12 MED ORDER — IOPAMIDOL (ISOVUE-M 200) INJECTION 41%
1.0000 mL | Freq: Once | INTRAMUSCULAR | Status: AC
  Administered 2022-02-12: 1 mL via EPIDURAL

## 2022-02-12 NOTE — Discharge Instructions (Signed)

## 2022-02-28 ENCOUNTER — Ambulatory Visit: Payer: 59 | Admitting: Physician Assistant

## 2022-03-05 NOTE — Progress Notes (Unsigned)
[place] Phone: [phone] Subjective:   Kelli Moore, am serving as a scribe for Dr. Hulan Saas.  I'm seeing this patient by the request  of:  [name], [name], MD  CC: Low back pain  [id number]  01/18/2022 Continues to have the radicular symptoms and does have weakness of the right foot still noted. With this I am concerned that patient does have a nerve root impingement that is contributing significantly. Will get MRI of the lumbar spine to further evaluate. Patient has failed other conservative therapy already to including home exercises, gabapentin, prednisone and oral anti-inflammatories. With the weakness could be advanced imaging is warranted and depending on findings could be a candidate for possible injections. Will follow-up after the imaging   Updated 03/06/2022 Kelli Moore is a 62 y.o. female coming in with complaint of LBP. Had epi on 02/12/2022. Patient states that the epidural was helpful. Pain is less intense and less frequent. Does yoga which is helping. Also going to the gym. Takes an occasional IBU.     Past Medical History:  Diagnosis Date   Anxiety    Basal cell carcinoma 04/28/2002   Right Inner Breast (curet and 5FU)   Depression    GERD (gastroesophageal reflux disease)    Mass of right breast 02/2017   Past Surgical History:  Procedure Laterality Date   ABDOMINAL SURGERY  2020   LIPOSUCTION   BREAST BIOPSY Right 02/08/2017   Stereotactic biopsy- Fibrocystic changes with calcs   BREAST EXCISIONAL BIOPSY Right 03/15/2017   BREAST LUMPECTOMY WITH RADIOACTIVE SEED LOCALIZATION Right 03/15/2017   Procedure: RIGHT BREAST LUMPECTOMY WITH RADIOACTIVE SEED LOCALIZATION ERAS PATHWAY;  Surgeon: Jovita Kussmaul, MD;  Location: [place];  Service: General;  Laterality: Right;   BUNIONECTOMY Right 2016   CARPAL TUNNEL RELEASE Right 11/16/2014   Procedure: CARPAL  TUNNEL RELEASE;  Surgeon: Daryll Brod, MD;  Location: [place];  Service: Orthopedics;  Laterality: Right;   DIAGNOSTIC LAPAROSCOPY     for pregnancy   METATARSAL OSTEOTOMY WITH BUNIONECTOMY Left 01/29/2017   TRIGGER FINGER RELEASE Right 11/16/2014   Procedure: RELEASE TRIGGER FINGER/A-1 PULLEY;  Surgeon: Daryll Brod, MD;  Location: [place];  Service: Orthopedics;  Laterality: Right;   Social History   Socioeconomic History   Marital status: Married    Spouse name: Not on file   Number of children: Not on file   Years of education: Not on file   Highest education level: Not on file  Occupational History   Not on file  Tobacco Use   Smoking status: Never   Smokeless tobacco: Never  Vaping Use   Vaping Use: Never used  Substance and Sexual Activity   Alcohol use: Yes    Comment: occasionally   Drug use: Moore   Sexual activity: Yes    Partners: Male    Birth control/protection: Post-menopausal    Comment: 1st intercourse- 84, partners- 86, married- 18.5 yrs   Other Topics Concern   Not on file  Social History Narrative   Not on file   Social Determinants of Health   Financial Resource Strain: Not on file  Food Insecurity: Not on file  Transportation Needs: Not on file  Physical Activity: Not on file  Stress: Not on file  Social Connections: Not on file   Moore Known Allergies Family History  Problem Relation Age of Onset   Stroke  Maternal Grandfather    Hypertension Father    Cancer Mother        colon   Cancer Maternal Aunt        ovarian   Cancer Maternal Uncle        liver   Cancer Maternal Uncle        liver   Breast cancer Neg Hx         Current Outpatient Medications (Respiratory):    azelastine (ASTELIN) 0.1 % nasal spray, Place 2 sprays into both nostrils 2 (two) times daily. Use in each nostril as directed   fluticasone (FLONASE) 50 MCG/ACT nasal spray, Place into the nose.       Current Outpatient Medications  (Other):    cycloSPORINE (CEQUA OP), Apply to eye.   doxylamine, Sleep, (UNISOM) 25 MG tablet, Take 25 mg by mouth at bedtime as needed.   estradiol (ESTRACE VAGINAL) 0.1 MG/GM vaginal cream, Insert 1 gram intravaginally twice weekly.   FLUoxetine (PROZAC) 40 MG capsule, Take 1 capsule (40 mg total) by mouth daily.   Multiple Vitamin (MULTIVITAMIN) tablet, Take 1 tablet by mouth daily.   pantoprazole (PROTONIX) 40 MG tablet, Take 1 tablet (40 mg total) by mouth daily.   Probiotic Product (PROBIOTIC ADVANCED PO), Take by mouth.   triamcinolone cream (KENALOG) 0.1 %, Apply 1 application topically daily.   XIIDRA 5 % SOLN, Apply 1 drop to eye 2 (two) times daily.   zolpidem (AMBIEN) 10 MG tablet, TAKE ONE TABLET BY MOUTH AT BEDTIME AS NEEDED FOR SLEEP  Current Facility-Administered Medications (Other):    Penicillin G Benzathine SUSP 2,400,000 Units   Reviewed prior external information including notes and imaging from  primary care provider As well as notes that were available from care everywhere and other healthcare systems.  Past medical history, social, surgical and family history all reviewed in electronic medical record.  Moore pertanent information unless stated regarding to the chief complaint.   Review of Systems:  Moore headache, visual changes, nausea, vomiting, diarrhea, constipation, dizziness, abdominal pain, skin rash, fevers, chills, night sweats, weight loss, swollen lymph nodes, body aches, joint swelling, chest pain, shortness of breath, mood changes. POSITIVE muscle aches  Objective  Blood pressure 112/84, pulse 92, height 5' 3"$  (1.6 m), weight 157 lb (71.2 kg), last menstrual period 09/14/2010, SpO2 98 %.   General: Moore apparent distress alert and oriented x3 mood and affect normal, dressed appropriately.  HEENT: Pupils equal, extraocular movements intact  Respiratory: Patient's speak in full sentences and does not appear short of breath  Cardiovascular: Moore lower extremity  edema, non tender, Moore erythema  Low back exam does have some loss of lordosis noted.  Patient does still have tightness on the right side of the back.  Still has what appears to be more of a tightness with patient's paraspinal musculature of the lumbar spine.  Moore weakness noted with dorsi flexion noted.    Impression and Recommendations:    The above documentation has been reviewed and is accurate and complete [name], DO

## 2022-03-06 ENCOUNTER — Ambulatory Visit: Payer: 59 | Admitting: Family Medicine

## 2022-03-06 DIAGNOSIS — M5416 Radiculopathy, lumbar region: Secondary | ICD-10-CM | POA: Diagnosis not present

## 2022-03-06 NOTE — Patient Instructions (Signed)
Glad you are better Can repeat epi every 6 weeks x3 or 3 in a 12 month period Also opportunity for a Medial [place] to increase activity See me when you need me

## 2022-03-06 NOTE — Assessment & Plan Note (Signed)
MRI did show that there was a nerve root impingement on the right side and patient did respond extremely well to the injection.  Discussed with patient that we can repeat this if necessary well as discussed

## 2022-03-14 ENCOUNTER — Other Ambulatory Visit (HOSPITAL_BASED_OUTPATIENT_CLINIC_OR_DEPARTMENT_OTHER): Payer: Self-pay | Admitting: Obstetrics & Gynecology

## 2022-03-14 DIAGNOSIS — Z1231 Encounter for screening mammogram for malignant neoplasm of breast: Secondary | ICD-10-CM

## 2022-03-22 ENCOUNTER — Other Ambulatory Visit (HOSPITAL_BASED_OUTPATIENT_CLINIC_OR_DEPARTMENT_OTHER): Payer: Self-pay | Admitting: Family Medicine

## 2022-03-26 ENCOUNTER — Ambulatory Visit (HOSPITAL_BASED_OUTPATIENT_CLINIC_OR_DEPARTMENT_OTHER)
Admission: RE | Admit: 2022-03-26 | Discharge: 2022-03-26 | Disposition: A | Discharge status: Home / Self Care | Payer: 59 | Source: Ambulatory Visit | Attending: Obstetrics & Gynecology | Admitting: Obstetrics & Gynecology

## 2022-03-26 DIAGNOSIS — Z1231 Encounter for screening mammogram for malignant neoplasm of breast: Secondary | ICD-10-CM | POA: Insufficient documentation

## 2022-04-19 ENCOUNTER — Other Ambulatory Visit (HOSPITAL_BASED_OUTPATIENT_CLINIC_OR_DEPARTMENT_OTHER): Payer: Self-pay | Admitting: Family Medicine

## 2022-04-24 ENCOUNTER — Emergency Department (HOSPITAL_COMMUNITY)
Admission: EM | Admit: 2022-04-24 | Discharge: 2022-04-24 | Disposition: A | Discharge status: Home / Self Care | Payer: 59 | Attending: Emergency Medicine | Admitting: Emergency Medicine

## 2022-04-24 ENCOUNTER — Emergency Department (HOSPITAL_COMMUNITY): Payer: 59

## 2022-04-24 ENCOUNTER — Other Ambulatory Visit: Payer: Self-pay

## 2022-04-24 DIAGNOSIS — R5383 Other fatigue: Secondary | ICD-10-CM | POA: Diagnosis not present

## 2022-04-24 DIAGNOSIS — R110 Nausea: Secondary | ICD-10-CM | POA: Diagnosis not present

## 2022-04-24 DIAGNOSIS — R112 Nausea with vomiting, unspecified: Secondary | ICD-10-CM | POA: Diagnosis not present

## 2022-04-24 DIAGNOSIS — R197 Diarrhea, unspecified: Secondary | ICD-10-CM | POA: Diagnosis not present

## 2022-04-24 DIAGNOSIS — R1084 Generalized abdominal pain: Secondary | ICD-10-CM | POA: Insufficient documentation

## 2022-04-24 DIAGNOSIS — R1111 Vomiting without nausea: Secondary | ICD-10-CM | POA: Diagnosis not present

## 2022-04-24 DIAGNOSIS — I10 Essential (primary) hypertension: Secondary | ICD-10-CM | POA: Diagnosis not present

## 2022-04-24 DIAGNOSIS — R0602 Shortness of breath: Secondary | ICD-10-CM | POA: Diagnosis not present

## 2022-04-24 DIAGNOSIS — Z743 Need for continuous supervision: Secondary | ICD-10-CM | POA: Diagnosis not present

## 2022-04-24 LAB — URINALYSIS, ROUTINE W REFLEX MICROSCOPIC
Bacteria, UA: NONE SEEN
Bilirubin Urine: NEGATIVE
Glucose, UA: NEGATIVE mg/dL
Hgb urine dipstick: NEGATIVE
Ketones, ur: 20 mg/dL — AB
Leukocytes,Ua: NEGATIVE
Nitrite: NEGATIVE
Protein, ur: 30 mg/dL — AB
Specific Gravity, Urine: 1.016 (ref 1.005–1.030)
pH: 8 (ref 5.0–8.0)

## 2022-04-24 LAB — CBC
HCT: 39 % (ref 36.0–46.0)
Hemoglobin: 12.8 g/dL (ref 12.0–15.0)
MCH: 30.8 pg (ref 26.0–34.0)
MCHC: 32.8 g/dL (ref 30.0–36.0)
MCV: 94 fL (ref 80.0–100.0)
Platelets: 278 10*3/uL (ref 150–400)
RBC: 4.15 MIL/uL (ref 3.87–5.11)
RDW: 12.6 % (ref 11.5–15.5)
WBC: 13.3 10*3/uL — ABNORMAL HIGH (ref 4.0–10.5)
nRBC: 0 % (ref 0.0–0.2)

## 2022-04-24 LAB — COMPREHENSIVE METABOLIC PANEL
ALT: 17 U/L (ref 0–44)
AST: 23 U/L (ref 15–41)
Albumin: 3.8 g/dL (ref 3.5–5.0)
Alkaline Phosphatase: 59 U/L (ref 38–126)
Anion gap: 9 (ref 5–15)
BUN: 22 mg/dL — ABNORMAL HIGH (ref 6–20)
CO2: 22 mmol/L (ref 22–32)
Calcium: 8.6 mg/dL — ABNORMAL LOW (ref 8.9–10.3)
Chloride: 106 mmol/L (ref 98–111)
Creatinine, Ser: 0.64 mg/dL (ref 0.44–1.00)
GFR, Estimated: >60 mL/min (ref >60)
Glucose, Bld: 114 mg/dL — ABNORMAL HIGH (ref 70–99)
Potassium: 3.8 mmol/L (ref 3.5–5.1)
Sodium: 137 mmol/L (ref 135–145)
Total Bilirubin: 0.8 mg/dL (ref 0.3–1.2)
Total Protein: 6.3 g/dL — ABNORMAL LOW (ref 6.5–8.1)

## 2022-04-24 LAB — LIPASE, BLOOD: Lipase: 29 U/L (ref 11–51)

## 2022-04-24 LAB — HCG, SERUM, QUALITATIVE: Preg, Serum: NEGATIVE

## 2022-04-24 LAB — CBG MONITORING, ED: Glucose-Capillary: 128 mg/dL — ABNORMAL HIGH (ref 70–99)

## 2022-04-24 MED ORDER — LACTATED RINGERS IV BOLUS
1000.0000 mL | Freq: Once | INTRAVENOUS | Status: AC
  Administered 2022-04-24: 1000 mL via INTRAVENOUS

## 2022-04-24 MED ORDER — MORPHINE SULFATE (PF) 4 MG/ML IV SOLN
4.0000 mg | Freq: Once | INTRAVENOUS | Status: AC
  Administered 2022-04-24: 4 mg via INTRAVENOUS
  Filled 2022-04-24: qty 1

## 2022-04-24 MED ORDER — ONDANSETRON 4 MG PO TBDP: 4.0000 mg | ORAL_TABLET | Freq: Three times a day (TID) | ORAL | 0 refills | Status: DC | PRN

## 2022-04-24 MED ORDER — ACETAMINOPHEN 500 MG PO TABS: 1000.0000 mg | ORAL_TABLET | Freq: Four times a day (QID) | ORAL | 0 refills | Status: DC | PRN

## 2022-04-24 MED ORDER — DICYCLOMINE HCL 10 MG PO CAPS
20.0000 mg | ORAL_CAPSULE | Freq: Once | ORAL | Status: AC
  Administered 2022-04-24: 20 mg via ORAL
  Filled 2022-04-24: qty 2

## 2022-04-24 MED ORDER — ONDANSETRON HCL 4 MG/2ML IJ SOLN
4.0000 mg | Freq: Once | INTRAMUSCULAR | Status: AC
  Administered 2022-04-24: 4 mg via INTRAVENOUS
  Filled 2022-04-24: qty 2

## 2022-04-24 MED ORDER — LACTATED RINGERS IV BOLUS: 2000.0000 mL | Freq: Once | INTRAVENOUS | Status: DC

## 2022-04-24 MED ORDER — DICYCLOMINE HCL 20 MG PO TABS: 20.0000 mg | ORAL_TABLET | Freq: Two times a day (BID) | ORAL | 0 refills | Status: DC

## 2022-04-24 MED ORDER — ONDANSETRON 4 MG PO TBDP
4.0000 mg | ORAL_TABLET | Freq: Once | ORAL | Status: AC
  Administered 2022-04-24: 4 mg via ORAL
  Filled 2022-04-24: qty 1

## 2022-04-24 NOTE — ED Notes (Signed)
Additional green tube sent to lab at this time

## 2022-04-24 NOTE — Discharge Instructions (Signed)
Make sure you are drinking plenty of water, you may use over-the-counter loperamide which is a antidiarrheal however I do recommend primarily relying on diet to help with your diarrhea.  Rice, bananas, applesauce, rice cakes, toast are all helpful in this regard.  Eat bland foods in general. Gatorade, Pedialyte, water.  I recommend taking 1 dose of Zofran when you wake up in the morning after that you can take as prescribed as needed.  Bentyl twice daily as prescribed.

## 2022-04-24 NOTE — ED Provider Notes (Signed)
[facility]ERGENCY DEPARTMENT AT [facility] Provider Note   CSN: [id number] Arrival date & time: 04/24/22  0135     History  Chief Complaint  Patient presents with   Nausea   Emesis   Diarrhea   Abdominal Pain    Kelli Moore is a 62 y.o. female.   Emesis Associated symptoms: abdominal pain and diarrhea   Diarrhea Associated symptoms: abdominal pain and vomiting   Abdominal Pain Associated symptoms: diarrhea and vomiting    Patient is a 62 year old female with past medical history significant for depression, anxiety, reflux   She presented emergency room today with complaints of nausea vomiting diarrhea she states that she is had more than 12 episodes of nonbloody nonbilious emesis and more than 12 episodes of watery diarrhea today.  She states her symptoms started this evening.  She denies any fevers or chills no urinary frequency urgency dysuria or hematuria.  No chest pain or difficulty breathing.  She feels significantly improved after getting 1 L of lactated Ringer's and 4 mg IV Zofran with EMS.  She feels overall fatigued but no focal weakness or extremity weakness.    Home Medications Prior to Admission medications   Medication Sig Start Date End Date Taking? Authorizing Provider  acetaminophen (TYLENOL) 500 MG tablet Take 2 tablets (1,000 mg total) by mouth every 6 (six) hours as needed. 04/24/22  Yes Saryn Cherry S, PA  dicyclomine (BENTYL) 20 MG tablet Take 1 tablet (20 mg total) by mouth 2 (two) times daily. 04/24/22  Yes Torrie Namba S, PA  dicyclomine (BENTYL) 20 MG tablet Take 1 tablet (20 mg total) by mouth 2 (two) times daily. 04/24/22  Yes Parys Elenbaas S, PA  ondansetron (ZOFRAN-ODT) 4 MG disintegrating tablet Take 1 tablet (4 mg total) by mouth every 8 (eight) hours as needed for nausea or vomiting. 04/24/22  Yes Estus Krakowski S, PA  ondansetron (ZOFRAN-ODT) 4 MG disintegrating tablet Take 1 tablet (4 mg total) by mouth every 8 (eight) hours  as needed for nausea or vomiting. 04/24/22  Yes Summar Mcglothlin S, PA  azelastine (ASTELIN) 0.1 % nasal spray Place 2 sprays into both nostrils 2 (two) times daily. Use in each nostril as directed 01/18/21   Early, Sung Amabile, NP  cycloSPORINE (CEQUA OP) Apply to eye.    [provider]  doxylamine, Sleep, (UNISOM) 25 MG tablet Take 25 mg by mouth at bedtime as needed.    [provider]  estradiol (ESTRACE VAGINAL) 0.1 MG/GM vaginal cream Insert 1 gram intravaginally twice weekly. 02/09/22   Marny Lowenstein, PA-C  FLUoxetine (PROZAC) 40 MG capsule Take 1 capsule (40 mg total) by mouth daily. 02/09/22   Marny Lowenstein, PA-C  fluticasone (FLONASE) 50 MCG/ACT nasal spray Place into the nose. 12/30/17   [provider]  Multiple Vitamin (MULTIVITAMIN) tablet Take 1 tablet by mouth daily.    [provider]  pantoprazole (PROTONIX) 40 MG tablet Take 1 tablet (40 mg total) by mouth daily. 02/01/22   de [place], Raymond J, MD  Probiotic Product (PROBIOTIC ADVANCED PO) Take by mouth.    [provider]  triamcinolone cream (KENALOG) 0.1 % Apply 1 application topically daily. 02/28/21   Sheffield, Judye Bos, PA-C  XIIDRA 5 % SOLN Apply 1 drop to eye 2 (two) times daily. 02/06/21   [provider]  zolpidem (AMBIEN) 10 MG tablet TAKE ONE TABLET BY MOUTH AT BEDTIME AS NEEDED FOR SLEEP 03/22/22   Novella Olive,  FNP      Allergies    Patient has no known allergies.    Review of Systems   Review of Systems  Gastrointestinal:  Positive for abdominal pain, diarrhea and vomiting.    Physical Exam Updated Vital Signs BP (!) 106/58   Pulse 88   Temp 98.3 F (36.8 C) (Oral)   Resp 16   Ht 5\' 4"  (1.626 m)   Wt 71.2 kg   LMP 09/14/2010   SpO2 93%   BMI 26.95 kg/m  Physical Exam Vitals and nursing note reviewed.  Constitutional:      General: She is not in acute distress. HENT:     Head: Normocephalic and atraumatic.     Nose: Nose normal.     Mouth/Throat:      Mouth: Mucous membranes are dry.  Eyes:     General: No scleral icterus. Cardiovascular:     Rate and Rhythm: Normal rate and regular rhythm.     Pulses: Normal pulses.     Heart sounds: Normal heart sounds.  Pulmonary:     Effort: Pulmonary effort is normal. No respiratory distress.     Breath sounds: No wheezing.  Abdominal:     Palpations: Abdomen is soft.     Tenderness: There is no abdominal tenderness.     Comments: Abdomen soft nontender.  No guarding or rebound  Musculoskeletal:     Cervical back: Normal range of motion.     Right lower leg: No edema.     Left lower leg: No edema.  Skin:    General: Skin is warm and dry.     Capillary Refill: Capillary refill takes less than 2 seconds.  Neurological:     Mental Status: She is alert. Mental status is at baseline.  Psychiatric:        Mood and Affect: Mood normal.        Behavior: Behavior normal.     ED Results / Procedures / Treatments   Labs (all labs ordered are listed, but only abnormal results are displayed) Labs Reviewed  CBC - Abnormal; Notable for the following components:      Result Value   WBC 13.3 (*)    All other components within normal limits  URINALYSIS, ROUTINE W REFLEX MICROSCOPIC - Abnormal; Notable for the following components:   APPearance HAZY (*)    Ketones, ur 20 (*)    Protein, ur 30 (*)    All other components within normal limits  COMPREHENSIVE METABOLIC PANEL - Abnormal; Notable for the following components:   Glucose, Bld 114 (*)    BUN 22 (*)    Calcium 8.6 (*)    Total Protein 6.3 (*)    All other components within normal limits  CBG MONITORING, ED - Abnormal; Notable for the following components:   Glucose-Capillary 128 (*)    All other components within normal limits  HCG, SERUM, QUALITATIVE  LIPASE, BLOOD    EKG None  Radiology DG Chest Portable 1 View  Result Date: 04/24/2022 CLINICAL DATA:  Shortness of breath. EXAM: PORTABLE CHEST 1 VIEW COMPARISON:  Cardiac CT  dated 06/29/2021. FINDINGS: The heart size and mediastinal contours are within normal limits. Both lungs are clear. The visualized skeletal structures are unremarkable. IMPRESSION: No active disease. Electronically Signed   By: Elgie Collard M.D.   On: 04/24/2022 03:28    Procedures Procedures    Medications Ordered in ED Medications  dicyclomine (BENTYL) capsule 20 mg (20 mg Oral Given 04/24/22  7829)  ondansetron [facility]) injection 4 mg (4 mg Intravenous Given 04/24/22 0319)  lactated ringers bolus 1,000 mL (0 mLs Intravenous Stopped 04/24/22 0550)  morphine (PF) 4 MG/ML injection 4 mg (4 mg Intravenous Given 04/24/22 0410)  ondansetron (ZOFRAN-ODT) disintegrating tablet 4 mg (4 mg Oral Given 04/24/22 0604)    ED Course/ Medical Decision Making/ A&P Clinical Course as of 04/25/22 0433  Tue Apr 24, 2022  0250 9pm -  12+ ep NB diarrhea 12+ ep vomiting No fever.  PB crackers only intake today.  [WF]    Clinical Course User Index [WF] Gailen Shelter, PA                             Medical Decision Making Amount and/or Complexity of Data Reviewed Labs: ordered. Radiology: ordered.  Risk OTC drugs. Prescription drug management.   This patient presents to the ED for concern of nausea vomiting diarrhea fatigue, this involves a number of treatment options, and is a complaint that carries with it a moderate risk of complications and morbidity. A differential diagnosis was considered for the patient's symptoms which is discussed below:   Severe dehydration from gastroenteritis is mild is likely differential diagnosis however also considered biliary disease, or DKA, metabolic derangement, hypoglycemia, sepsis   Co morbidities: Discussed in HPI   Brief History:  Patient is a 62 year old female with past medical history significant for depression, anxiety, reflux   She presented emergency room today with complaints of nausea vomiting diarrhea she states that she is had more than 12  episodes of nonbloody nonbilious emesis and more than 12 episodes of watery diarrhea today.  She states her symptoms started this evening.  She denies any fevers or chills no urinary frequency urgency dysuria or hematuria.  No chest pain or difficulty breathing.  She feels significantly improved after getting 1 L of lactated Ringer's and 4 mg IV Zofran with EMS.  She feels overall fatigued but no focal weakness or extremity weakness.    EMR reviewed including pt PMHx, past surgical history and past visits to ER.   See HPI for more details   Lab Tests:   I ordered and independently interpreted labs. Labs notable for Mild nonspecific leukocytosis in the setting of multiple episodes of vomiting.  No anemia.  Urinalysis unremarkable apart from ketones consistent with dehydration and starvation ketosis.  CMP with mild elevation in BUN but no AKI.  Electrolytes normal lipase within normal limits urine pregnancy negative.  Imaging Studies:  NAD. I personally reviewed all imaging studies and no acute abnormality found. I agree with radiology interpretation.  Chest x-ray unremarkable  Cardiac Monitoring:  The patient was maintained on a cardiac monitor.  I personally viewed and interpreted the cardiac monitored which showed an underlying rhythm of: NSR EKG non-ischemic QT interval less than 500   Medicines ordered:  I ordered medication including Zofran, lactated Ringer's, morphine, Bentyl, Zofran for nausea vomiting diarrhea and pain Reevaluation of the patient after these medicines showed that the patient resolved I have reviewed the patients home medicines and have made adjustments as needed   Critical Interventions:     Consults/Attending Physician      Reevaluation:  After the interventions noted above I re-evaluated patient and found that they have :resolved   Social Determinants of Health:      Problem List / ED Course:  Nausea vomiting diarrhea generalized  abdominal pain that is achy and crampy  and fatigue.  I suspect that her symptoms are related to dehydration related to her nausea vomiting diarrhea.  She has only had 1 episode of diarrhea here during her 5-hour observation period in the emergency room.  No episodes of vomiting and she feels no longer nauseous at all.  She is tolerating p.o. in the form of solid food and liquid.  She is ambulatory.  I did share decision-making conversation with patient regarding whether to obtain a CT scan of her abdomen pelvis or not.  She states she prefer to go home at this time she is feeling well.  Return precautions reiterated and she is agreeable to plan.  Will discharge home at this time   Dispostion:  After consideration of the diagnostic results and the patients response to treatment, I feel that the patent would benefit from close outpatient follow-up.  Return to the ER for any new or concerning symptoms.   Final Clinical Impression(s) / ED Diagnoses Final diagnoses:  Nausea vomiting and diarrhea    Rx / DC Orders ED Discharge Orders          Ordered    ondansetron (ZOFRAN-ODT) 4 MG disintegrating tablet  Every 8 hours PRN        04/24/22 0543    dicyclomine (BENTYL) 20 MG tablet  2 times daily        04/24/22 0543    acetaminophen (TYLENOL) 500 MG tablet  Every 6 hours PRN        04/24/22 0543    ondansetron (ZOFRAN-ODT) 4 MG disintegrating tablet  Every 8 hours PRN        04/24/22 0614    dicyclomine (BENTYL) 20 MG tablet  2 times daily        04/24/22 0614              Gailen Shelter, PA 04/25/22 0436    Nira Conn, MD 04/28/22 1751

## 2022-04-24 NOTE — ED Notes (Signed)
Pt ambulatory to bathroom with assistance. Pt c/o mild lightheadedness from laying to standing position.

## 2022-04-24 NOTE — ED Triage Notes (Signed)
Pt BIBA from home c/o n/v/d and dizziness x4 hours. Pt also c/o lower abdominal pain. Pt states that she is having acute on chronic lower back pain as well. EMS reports pt was initially very pale and dizzy, but color has improved with treatment. Denies blood in emesis or stool.  EMS vitals: 98/60 initially, 108/64 after fluids, HR 84, CBG 134, SpO2 99%  Received 1050mL LR and 4 mg IV zofran per EMS.

## 2022-04-26 ENCOUNTER — Telehealth: Payer: Self-pay

## 2022-04-26 NOTE — Transitions of Care (Post Inpatient/ED Visit) (Signed)
   04/26/2022  Name: Kelli Moore MRN: [id number] DOB: December 20, 1960  Today's TOC FU Call Status: Today's TOC FU Call Status:: Unsuccessul Call (1st Attempt) Unsuccessful Call (1st Attempt) Date: 04/26/22 Scarlett Presto on EMMI-ED Discharge Alert Date & Reason: 04/25/22  "Scheduled follow-up appt? No")  Attempted to reach the patient regarding the most recent Inpatient/ED visit.  Follow Up Plan: Additional outreach attempts will be made to reach the patient to complete the Transitions of Care (Post Inpatient/ED visit) call.     Enzo Montgomery, RN,BSN,CCM [facility] Care Management Care Management Community Coordinator Direct Phone: [phone] Toll Free: [phone] Fax: [phone]

## 2022-04-27 ENCOUNTER — Telehealth: Payer: Self-pay

## 2022-04-27 NOTE — Transitions of Care (Post Inpatient/ED Visit) (Signed)
   04/27/2022  Name: Kelli Moore MRN: [id number] DOB: 1960/04/15  Today's TOC FU Call Status: Today's TOC FU Call Status:: Successful TOC FU Call Competed TOC FU Call Complete Date: 04/27/22  Transition Care Management Follow-up Telephone Call Date of Discharge: 04/24/22 Discharge Facility: [facility]) Type of Discharge: Emergency Department Reason for ED Visit: Other: ("Nausea,vomiting and diarrhea") How have you been since you were released from the hospital?: Better (Pt states sxs are improving. She has had no nausea or vomiting-has not had to take any Zofran. LBM was yesterday and "getting more normal." Appeitie is coming back-able to eat small meals.) Any questions or concerns?: No Red on EMMI-ED Discharge Alert Date & Reason:04/25/22 "Scheduled follow-up appt? No" Items Reviewed: Did you receive and understand the discharge instructions provided?: Yes Medications obtained and verified?: Yes (Medications Reviewed) Any new allergies since your discharge?: No Dietary orders reviewed?: Yes Type of Diet Ordered:: low salt/heart healthy Do you have support at home?: Yes People in Home: spouse Name of Support/Comfort Primary Source: [facility] and Equipment/Supplies: Were Home Health Services Ordered?: NA Any new equipment or medical supplies ordered?: NA  Functional Questionnaire: Do you need assistance with bathing/showering or dressing?: No Do you need assistance with meal preparation?: No Do you need assistance with eating?: No Do you have difficulty maintaining continence: No Do you need assistance with getting out of bed/getting out of a chair/moving?: No Do you have difficulty managing or taking your medications?: No  Follow up appointments reviewed: PCP Follow-up appointment confirmed?: NA (not advised to f/u on d/c instructions-provider currently out of office on leave-pt aware to make an appt for any worsening and/or unresolved sxs) [facility]  Follow-up appointment confirmed?: NA Do you need transportation to your follow-up appointment?: No Do you understand care options if your condition(s) worsen?: Yes-patient verbalized understanding  SDOH Interventions Today    Flowsheet Row Most Recent Value  SDOH Interventions   Food Insecurity Interventions Intervention Not Indicated  Transportation Interventions Intervention Not Indicated      TOC Interventions Today    Flowsheet Row Most Recent Value  TOC Interventions   TOC Interventions Discussed/Reviewed TOC Interventions Discussed      Interventions Today    Flowsheet Row Most Recent Value  General Interventions   General Interventions Discussed/Reviewed General Interventions Discussed, Doctor Visits  Doctor Visits Discussed/Reviewed Doctor Visits Discussed, PCP  PCP/Specialist Visits Compliance with follow-up visit  Education Interventions   Education Provided Provided Education  Provided Verbal Education On Nutrition, When to see the doctor, Medication, Other  [sx mgmt]  Nutrition Interventions   Nutrition Discussed/Reviewed Nutrition Discussed  Pharmacy Interventions   Pharmacy Dicussed/Reviewed Pharmacy Topics Discussed, Medications and their functions        Alessandra Grout [facility] Care Management Care Management Community Coordinator Direct Phone: [phone] Toll Free: [phone] Fax: [phone]

## 2022-05-01 ENCOUNTER — Telehealth: Payer: Self-pay

## 2022-05-01 NOTE — Patient Outreach (Signed)
  Care Coordination   05/01/2022 Name: Kelli Moore MRN: [id number] DOB: [birth year]   Care Coordination Outreach Attempts:  An unsuccessful outreach attempt was made to address:   Red on EMMI-ED Discharge Alert Date & Reason: 4/624 "Lost interest in things they used to enjoy? Yes"    Follow Up Plan:  Additional outreach attempt will be made to address red alert.  Encounter Outcome:  No Answer   Care Coordination Interventions:  No, not indicated      Antionette Fairy, RN,BSN,CCM [facility] Care Management Care Management Community Coordinator Direct Phone: [phone] Toll Free: [phone] Fax: [phone]

## 2022-05-02 ENCOUNTER — Telehealth: Payer: Self-pay

## 2022-05-02 NOTE — Patient Outreach (Signed)
  Care Coordination   Follow Up Visit Note   05/02/2022 Name: Kelli Moore MRN: [id number] DOB: November 10, 1960     Red on EMMI-ED Discharge Alert Date & Reason: 04/28/22 "Lost interest in things they used to enjoy? Yes"     Spoke with patient. She states GI sxs from recent ED visit have continued to improve. She has had no nausea,vomiting or diarrhea for past few days. Her appetite is coming back. Reviewed addressed red alert. Patient states she was "just feeling sick and didn't want to be around people or bothered that day." Since then she is physically feeling better and mood is back to normal. Denies any RN CM needs or concerns at this time.   Care Coordination Interventions:  Yes, provided   Follow up plan: No further intervention required.   Encounter Outcome:  Pt. Visit Completed   Alessandra Grout [facility] Care Management Care Management Community Coordinator Direct Phone: [phone] Toll Free: [phone] Fax: [phone]

## 2022-05-17 DIAGNOSIS — Z800 Family history of malignant neoplasm of digestive organs: Secondary | ICD-10-CM | POA: Diagnosis not present

## 2022-05-17 DIAGNOSIS — K219 Gastro-esophageal reflux disease without esophagitis: Secondary | ICD-10-CM | POA: Diagnosis not present

## 2022-05-17 DIAGNOSIS — K5900 Constipation, unspecified: Secondary | ICD-10-CM | POA: Diagnosis not present

## 2022-05-17 DIAGNOSIS — K600 Acute anal fissure: Secondary | ICD-10-CM | POA: Diagnosis not present

## 2022-06-05 ENCOUNTER — Ambulatory Visit (HOSPITAL_BASED_OUTPATIENT_CLINIC_OR_DEPARTMENT_OTHER): Payer: 59 | Admitting: Family Medicine

## 2022-08-20 DIAGNOSIS — M1712 Unilateral primary osteoarthritis, left knee: Secondary | ICD-10-CM | POA: Diagnosis not present

## 2022-09-05 DIAGNOSIS — M1712 Unilateral primary osteoarthritis, left knee: Secondary | ICD-10-CM | POA: Diagnosis not present

## 2022-11-21 ENCOUNTER — Other Ambulatory Visit (HOSPITAL_BASED_OUTPATIENT_CLINIC_OR_DEPARTMENT_OTHER): Payer: Self-pay | Admitting: Family Medicine

## 2022-11-28 DIAGNOSIS — D1801 Hemangioma of skin and subcutaneous tissue: Secondary | ICD-10-CM | POA: Diagnosis not present

## 2022-11-28 DIAGNOSIS — Z08 Encounter for follow-up examination after completed treatment for malignant neoplasm: Secondary | ICD-10-CM | POA: Diagnosis not present

## 2022-11-28 DIAGNOSIS — L821 Other seborrheic keratosis: Secondary | ICD-10-CM | POA: Diagnosis not present

## 2022-11-28 DIAGNOSIS — L814 Other melanin hyperpigmentation: Secondary | ICD-10-CM | POA: Diagnosis not present

## 2022-11-28 DIAGNOSIS — L244 Irritant contact dermatitis due to drugs in contact with skin: Secondary | ICD-10-CM | POA: Diagnosis not present

## 2022-11-28 DIAGNOSIS — Z85828 Personal history of other malignant neoplasm of skin: Secondary | ICD-10-CM | POA: Diagnosis not present

## 2022-11-29 ENCOUNTER — Other Ambulatory Visit (HOSPITAL_BASED_OUTPATIENT_CLINIC_OR_DEPARTMENT_OTHER): Payer: Self-pay | Admitting: Family Medicine

## 2022-11-30 MED ORDER — ZOLPIDEM TARTRATE 5 MG PO TABS: 5.0000 mg | ORAL_TABLET | Freq: Every evening | ORAL | 0 refills | Status: AC | PRN

## 2023-01-02 DIAGNOSIS — L818 Other specified disorders of pigmentation: Secondary | ICD-10-CM | POA: Diagnosis not present

## 2023-01-02 DIAGNOSIS — L244 Irritant contact dermatitis due to drugs in contact with skin: Secondary | ICD-10-CM | POA: Diagnosis not present

## 2023-01-14 DIAGNOSIS — M1712 Unilateral primary osteoarthritis, left knee: Secondary | ICD-10-CM | POA: Diagnosis not present

## 2023-01-21 ENCOUNTER — Ambulatory Visit: Payer: 59 | Admitting: Family Medicine

## 2023-02-12 ENCOUNTER — Other Ambulatory Visit (HOSPITAL_BASED_OUTPATIENT_CLINIC_OR_DEPARTMENT_OTHER): Payer: Self-pay | Admitting: Family Medicine

## 2023-02-12 DIAGNOSIS — Z1231 Encounter for screening mammogram for malignant neoplasm of breast: Secondary | ICD-10-CM

## 2023-02-22 ENCOUNTER — Other Ambulatory Visit (HOSPITAL_BASED_OUTPATIENT_CLINIC_OR_DEPARTMENT_OTHER): Payer: Self-pay | Admitting: Family Medicine

## 2023-03-07 ENCOUNTER — Ambulatory Visit (HOSPITAL_BASED_OUTPATIENT_CLINIC_OR_DEPARTMENT_OTHER): Payer: 59 | Admitting: Certified Nurse Midwife

## 2023-03-07 ENCOUNTER — Ambulatory Visit (HOSPITAL_BASED_OUTPATIENT_CLINIC_OR_DEPARTMENT_OTHER): Payer: 59 | Admitting: Obstetrics & Gynecology

## 2023-03-17 ENCOUNTER — Other Ambulatory Visit (HOSPITAL_BASED_OUTPATIENT_CLINIC_OR_DEPARTMENT_OTHER): Payer: Self-pay | Admitting: Medical

## 2023-03-17 DIAGNOSIS — F3289 Other specified depressive episodes: Secondary | ICD-10-CM

## 2023-03-18 ENCOUNTER — Ambulatory Visit (HOSPITAL_BASED_OUTPATIENT_CLINIC_OR_DEPARTMENT_OTHER): Payer: BC Managed Care – PPO | Admitting: Obstetrics & Gynecology

## 2023-03-27 ENCOUNTER — Encounter (HOSPITAL_BASED_OUTPATIENT_CLINIC_OR_DEPARTMENT_OTHER): Payer: Self-pay | Admitting: *Deleted

## 2023-03-28 ENCOUNTER — Ambulatory Visit (HOSPITAL_BASED_OUTPATIENT_CLINIC_OR_DEPARTMENT_OTHER)
Admission: RE | Admit: 2023-03-28 | Discharge: 2023-03-28 | Disposition: A | Discharge status: Home / Self Care | Payer: 59 | Source: Ambulatory Visit | Attending: Family Medicine | Admitting: Family Medicine

## 2023-03-28 ENCOUNTER — Encounter (HOSPITAL_BASED_OUTPATIENT_CLINIC_OR_DEPARTMENT_OTHER): Payer: Self-pay | Admitting: Radiology

## 2023-03-28 ENCOUNTER — Ambulatory Visit (HOSPITAL_BASED_OUTPATIENT_CLINIC_OR_DEPARTMENT_OTHER): Payer: BC Managed Care – PPO | Admitting: Obstetrics & Gynecology

## 2023-03-28 ENCOUNTER — Other Ambulatory Visit (HOSPITAL_COMMUNITY)
Admission: RE | Admit: 2023-03-28 | Discharge: 2023-03-28 | Disposition: A | Discharge status: Home / Self Care | Source: Ambulatory Visit | Attending: Obstetrics & Gynecology | Admitting: Obstetrics & Gynecology

## 2023-03-28 ENCOUNTER — Encounter (HOSPITAL_BASED_OUTPATIENT_CLINIC_OR_DEPARTMENT_OTHER): Payer: Self-pay | Admitting: Obstetrics & Gynecology

## 2023-03-28 VITALS — BP 137/61 | HR 78 | Ht 64.0 in | Wt 160.4 lb

## 2023-03-28 DIAGNOSIS — N952 Postmenopausal atrophic vaginitis: Secondary | ICD-10-CM

## 2023-03-28 DIAGNOSIS — Z124 Encounter for screening for malignant neoplasm of cervix: Secondary | ICD-10-CM | POA: Insufficient documentation

## 2023-03-28 DIAGNOSIS — Z780 Asymptomatic menopausal state: Secondary | ICD-10-CM | POA: Diagnosis not present

## 2023-03-28 DIAGNOSIS — Z1231 Encounter for screening mammogram for malignant neoplasm of breast: Secondary | ICD-10-CM | POA: Insufficient documentation

## 2023-03-28 DIAGNOSIS — F3289 Other specified depressive episodes: Secondary | ICD-10-CM

## 2023-03-28 DIAGNOSIS — Z01419 Encounter for gynecological examination (general) (routine) without abnormal findings: Secondary | ICD-10-CM | POA: Diagnosis not present

## 2023-03-28 DIAGNOSIS — Z87440 Personal history of urinary (tract) infections: Secondary | ICD-10-CM

## 2023-03-28 MED ORDER — PROGESTERONE MICRONIZED 100 MG PO CAPS: 100.0000 mg | ORAL_CAPSULE | Freq: Every day | ORAL | 3 refills | Status: AC

## 2023-03-28 MED ORDER — ESTRADIOL 0.1 MG/GM VA CREA: TOPICAL_CREAM | VAGINAL | 12 refills | Status: AC

## 2023-03-28 MED ORDER — FLUOXETINE HCL 20 MG PO CAPS: 20.0000 mg | ORAL_CAPSULE | Freq: Every day | ORAL | 3 refills | Status: AC

## 2023-03-28 MED ORDER — NITROFURANTOIN MONOHYD MACRO 100 MG PO CAPS: 100.0000 mg | ORAL_CAPSULE | Freq: Two times a day (BID) | ORAL | 0 refills | Status: AC

## 2023-03-28 NOTE — Progress Notes (Signed)
 ANNUAL EXAM Patient name: Kelli Moore MRN [id number]  Date of birth: 09/25/1960 Chief Complaint:   Annual Exam  History of Present Illness:   Kelli Moore is a 63 y.o. G0P0000 Caucasian female being seen today for a routine annual exam.   Having more issues with urinary incontinence.  She has this with cough, sneeze, laugh.  Has done PT and didn't feel this helped much.  She is using vaginal estrogen but would like to increase frequency of dosing.  Will add progesterone for endometrial protection.    Has hx of cystitis.  Has upcoming trip and would like rx for traveling.    Patient's last menstrual period was 09/14/2010.  Last pap 2022 with neg HR HPV in 2021.   Last mammogram: done today. Results are pending. Family h/o breast cancer: no Last colonoscopy: 03/01/2026. Follow up 5 years. Family h/o colorectal cancer: yes : mother BMD: not done yet     03/28/2023    4:22 PM 02/09/2022    9:41 AM 02/01/2022    2:51 PM 05/04/2021   10:41 AM 01/18/2021    2:22 PM  Depression screen PHQ 2/9  Decreased Interest 0 0 0 0 0  Down, Depressed, Hopeless 0 0 0 0 0  PHQ - 2 Score 0 0 0 0 0  Altered sleeping   0  2  Tired, decreased energy   0  1  Change in appetite   0  0  Feeling bad or failure about yourself    0  0  Trouble concentrating   0  0  Moving slowly or fidgety/restless   0  0  Suicidal thoughts   0  0  PHQ-9 Score   0  3  Difficult doing work/chores   Not difficult at all       Review of Systems:   Pertinent items are noted in HPI  Denies any headaches, blurred vision, fatigue, shortness of breath, chest pain, abdominal pain, abnormal vaginal discharge/itching/odor/irritation, problems with periods, bowel movements, urination, or intercourse unless otherwise stated above.  Pertinent History Reviewed:  Reviewed past medical,surgical, social and family history.   Reviewed problem list, medications and allergies. Physical Assessment:   Vitals:   03/28/23 1604  BP:  137/61  Pulse: 78  Weight: 160 lb 6.4 oz (72.8 kg)  Height: 5\' 4"  (1.626 m)  Body mass index is 27.53 kg/m.        Physical Examination:   General appearance - well appearing, and in no distress  Mental status - alert, oriented to person, place, and time  Psych:  She has a normal mood and affect  Skin - warm and dry, normal color, no suspicious lesions noted  Chest - effort normal, all lung fields clear to auscultation bilaterally  Heart - normal rate and regular rhythm  Neck:  midline trachea, no thyromegaly or nodules  Breasts - breasts appear normal, no suspicious masses, no skin or nipple changes or  axillary nodes  Abdomen - soft, nontender, nondistended, no masses or organomegaly  Pelvic - VULVA: normal appearing vulva with no masses, tenderness or lesions   VAGINA: atrophic mucosa, no bleeding, no lesions    CERVIX: normal appearing cervix without discharge or lesions, no CMT  Thin prep pap is done with HR HPV cotesting  UTERUS: uterus is felt to be normal size, shape, consistency and nontender   ADNEXA: No adnexal masses or tenderness noted.  Rectal - normal rectal, good sphincter tone, no masses felt.  Extremities:  No swelling or varicosities noted  Chaperone present for exam, Ina Homes, CMA  Assessment & Plan:  1. Well woman exam with routine gynecological exam (Primary) - Pap smear updated today with HR HPV - Mammogram 03/28/2023 - Colonoscopy 03/01/2021 - lab work done with PCP, Ladora Daniel - vaccines reviewed/updated  2. Cervical cancer screening - Cytology - PAP( [place])  3. Other depression - FLUoxetine (PROZAC) 20 MG capsule; Take 1 capsule (20 mg total) by mouth daily.  Dispense: 90 capsule; Refill: 3  4. Postmenopausal  5. Vaginal atrophy - estradiol (ESTRACE VAGINAL) 0.1 MG/GM vaginal cream; Insert 1 gram every other night  Dispense: 42.5 g; Refill: 12 - we discussed using three times weekly or every other night so will add progesterone as discussed  above.   - progesterone (PROMETRIUM) 100 MG capsule; Take 1 capsule (100 mg total) by mouth at bedtime.  Dispense: 90 capsule; Refill: 3  6. History of cystitis - nitrofurantoin, macrocrystal-monohydrate, (MACROBID) 100 MG capsule; Take 1 capsule (100 mg total) by mouth 2 (two) times daily.  Dispense: 10 capsule; Refill: 0    Meds:  Meds ordered this encounter  Medications   FLUoxetine (PROZAC) 20 MG capsule    Sig: Take 1 capsule (20 mg total) by mouth daily.    Dispense:  90 capsule    Refill:  3   estradiol (ESTRACE VAGINAL) 0.1 MG/GM vaginal cream    Sig: Insert 1 gram every other night    Dispense:  42.5 g    Refill:  12   progesterone (PROMETRIUM) 100 MG capsule    Sig: Take 1 capsule (100 mg total) by mouth at bedtime.    Dispense:  90 capsule    Refill:  3   nitrofurantoin, macrocrystal-monohydrate, (MACROBID) 100 MG capsule    Sig: Take 1 capsule (100 mg total) by mouth 2 (two) times daily.    Dispense:  10 capsule    Refill:  0    Follow-up: Return in about 1 year (around 03/27/2024).  Jerene Bears, MD 03/31/2023 5:50 PM

## 2023-04-03 LAB — CYTOLOGY - PAP
Comment: NEGATIVE
Diagnosis: NEGATIVE
High risk HPV: NEGATIVE

## 2023-04-04 ENCOUNTER — Encounter (HOSPITAL_BASED_OUTPATIENT_CLINIC_OR_DEPARTMENT_OTHER): Payer: Self-pay | Admitting: Obstetrics & Gynecology

## 2023-04-18 ENCOUNTER — Encounter (HOSPITAL_BASED_OUTPATIENT_CLINIC_OR_DEPARTMENT_OTHER): Payer: Self-pay | Admitting: Family Medicine

## 2023-04-30 ENCOUNTER — Ambulatory Visit (HOSPITAL_BASED_OUTPATIENT_CLINIC_OR_DEPARTMENT_OTHER): Payer: 59 | Admitting: Obstetrics & Gynecology

## 2023-10-15 ENCOUNTER — Other Ambulatory Visit: Payer: Self-pay | Admitting: Medical Genetics

## 2023-11-07 ENCOUNTER — Other Ambulatory Visit: Payer: Self-pay

## 2023-11-07 DIAGNOSIS — Z006 Encounter for examination for normal comparison and control in clinical research program: Secondary | ICD-10-CM

## 2023-11-15 LAB — GENECONNECT MOLECULAR SCREEN: Genetic Analysis Overall Interpretation: NEGATIVE

## 2024-01-19 IMAGING — MG MM DIGITAL SCREENING BILAT W/ TOMO AND CAD
8 series · 8 of 24 positions shown · non-contrast
Comparison: Previous exam(s).

CLINICAL DATA: Screening.

EXAM:
DIGITAL SCREENING BILATERAL MAMMOGRAM WITH TOMOSYNTHESIS AND CAD
TECHNIQUE: Bilateral screening digital craniocaudal and mediolateral oblique
mammograms were obtained. Bilateral screening digital breast
tomosynthesis was performed. The images were evaluated with
computer-aided detection.

[L MLO synth-2D]
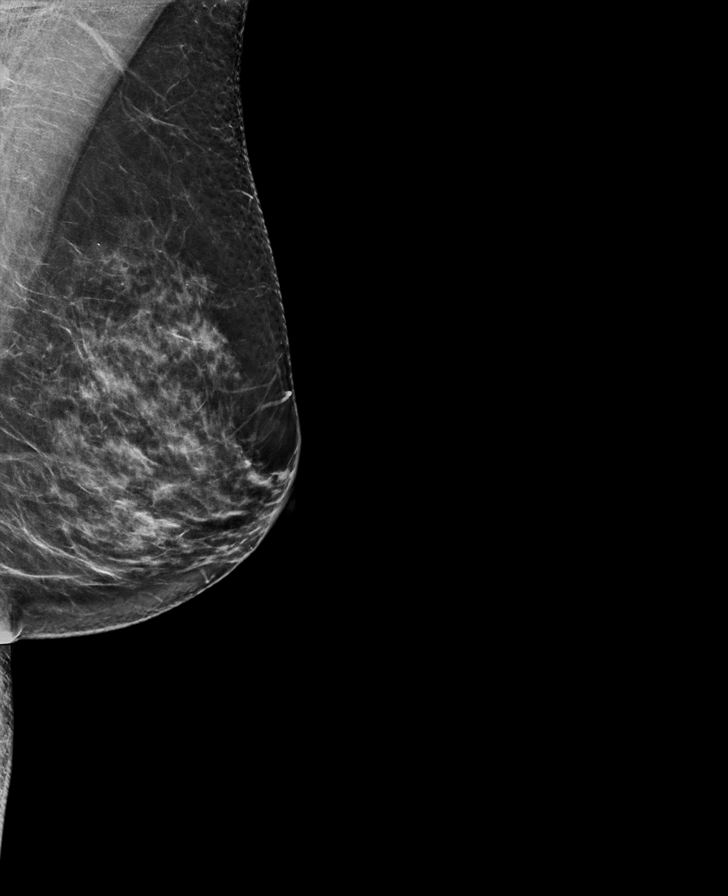

[L CC synth-2D]
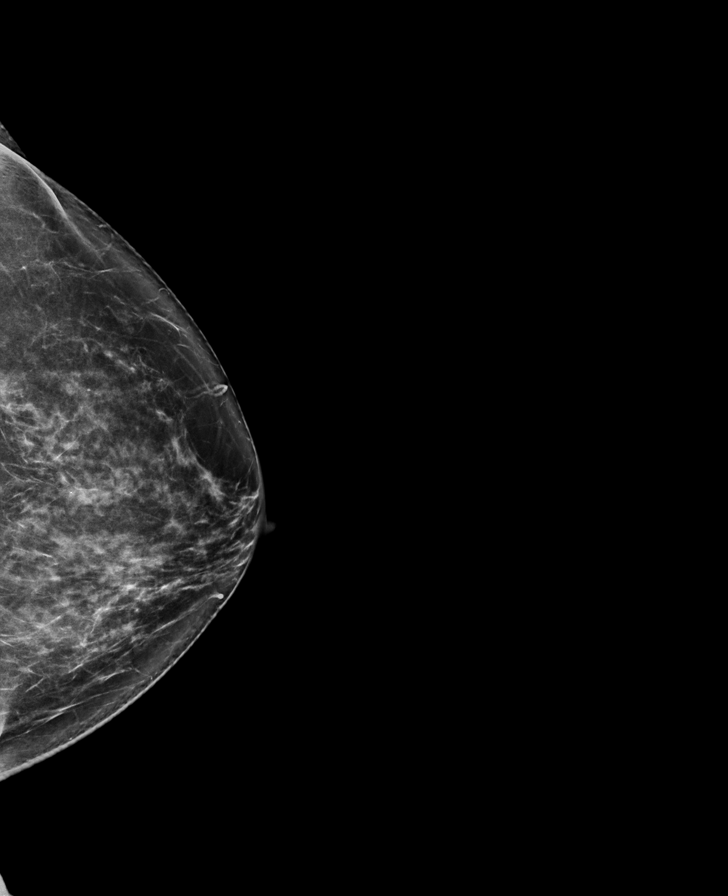

[R MLO synth-2D]
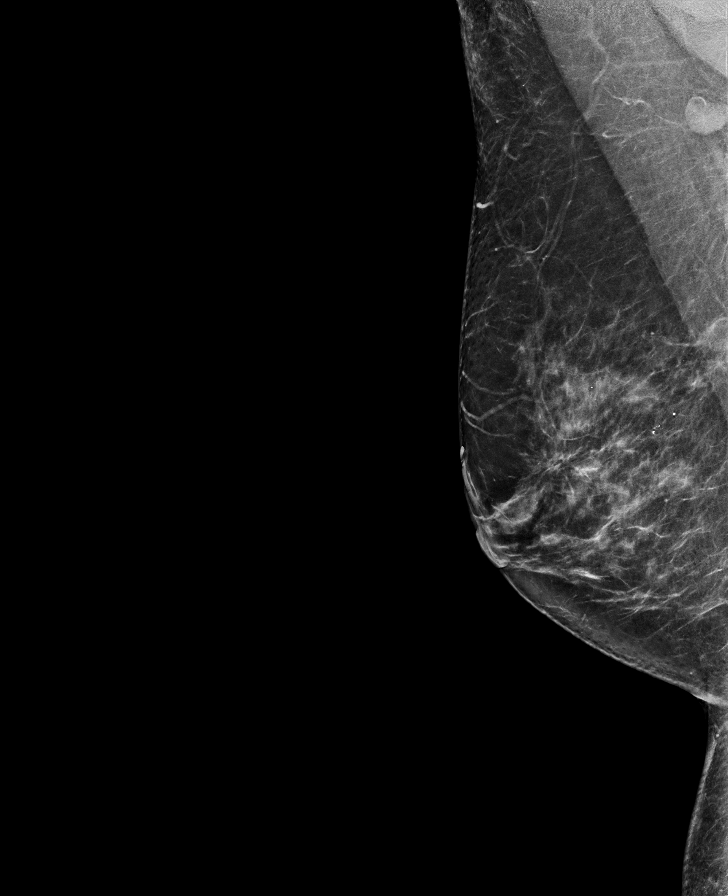

[R CC synth-2D]
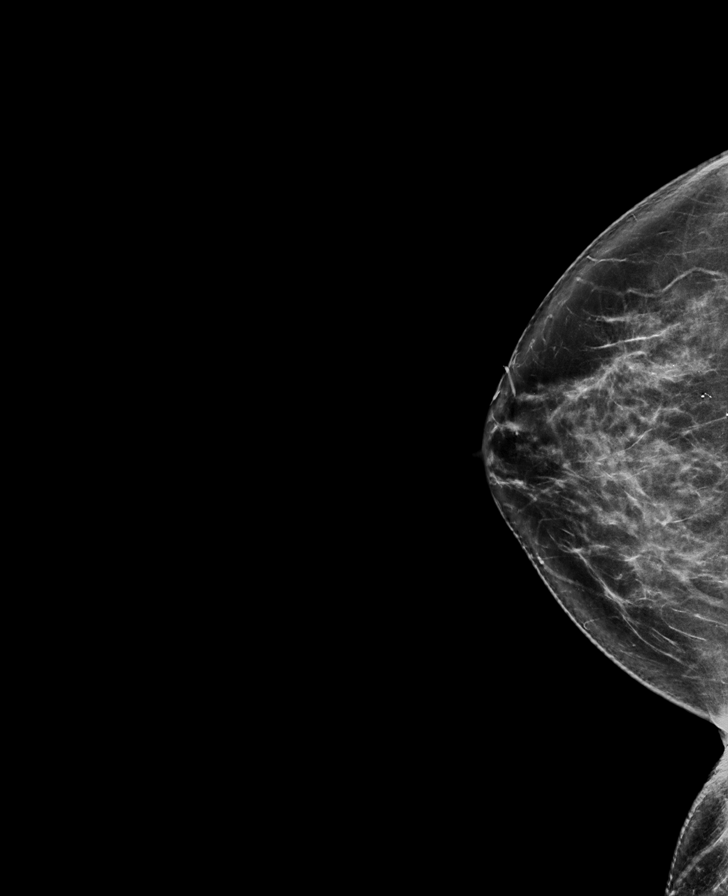

[L MLO tomo · tomo slice 39/76.0]
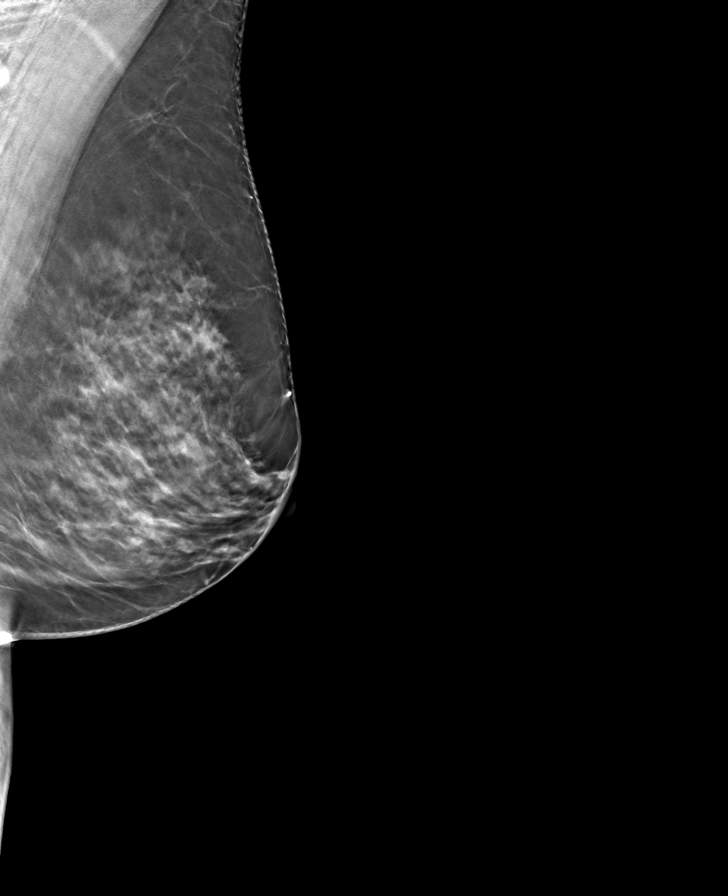

[R MLO tomo · tomo slice 39/76.0]
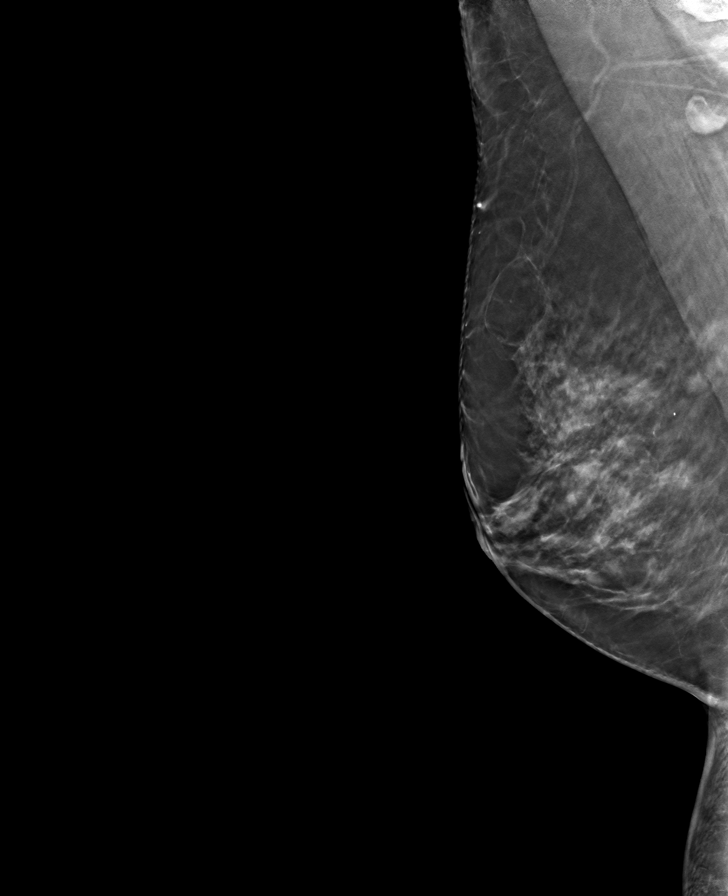

[R CC tomo · tomo slice 40/79.0]
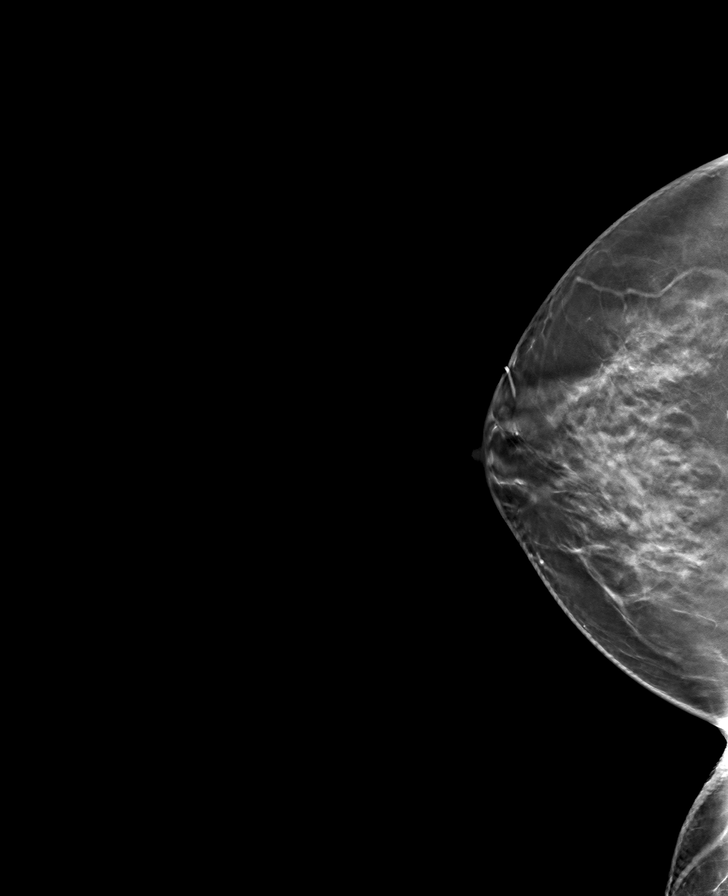

[L CC tomo · tomo slice 39/76.0]
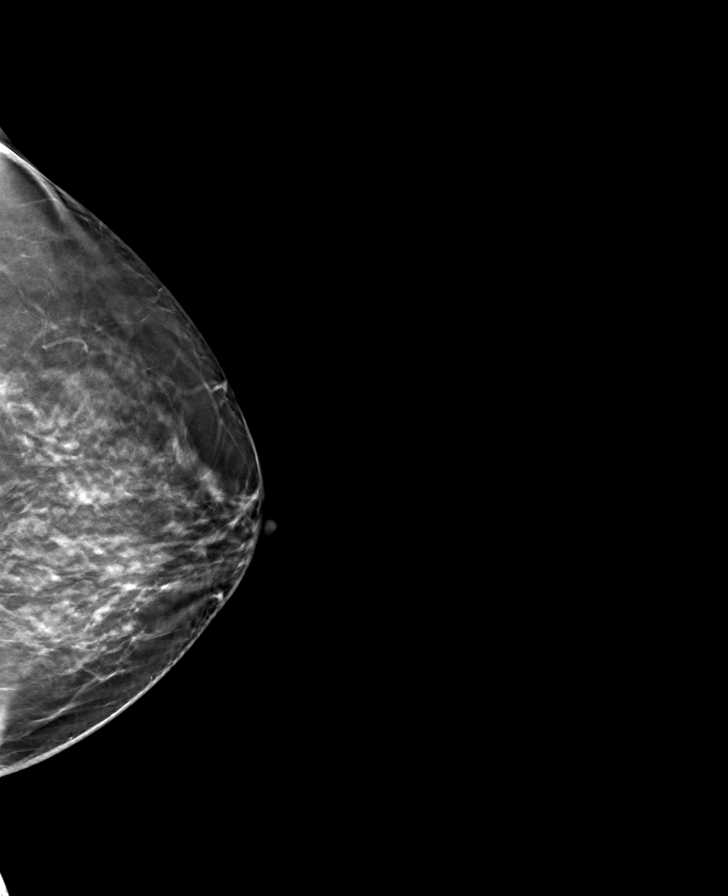

[8 of 24 positions shown; findings below may reference images not displayed]

ACR Breast Density Category c: The breast tissue is heterogeneously
dense, which may obscure small masses.
FINDINGS: There are no findings suspicious for malignancy.
IMPRESSION: No mammographic evidence of malignancy. A result letter of this
screening mammogram will be mailed directly to the patient.

RECOMMENDATION:
Screening mammogram in one year. (Code:Q3-W-BC3)

BI-RADS CATEGORY  1: Negative.

## 2024-03-30 ENCOUNTER — Ambulatory Visit (HOSPITAL_BASED_OUTPATIENT_CLINIC_OR_DEPARTMENT_OTHER): Admitting: Obstetrics & Gynecology

## 2024-04-03 ENCOUNTER — Encounter (HOSPITAL_BASED_OUTPATIENT_CLINIC_OR_DEPARTMENT_OTHER): Admitting: Radiology

## 2024-04-03 DIAGNOSIS — Z1231 Encounter for screening mammogram for malignant neoplasm of breast: Secondary | ICD-10-CM
